# Patient Record
Sex: Female | Born: 1960 | Race: Black or African American | Hispanic: No | Smoking: Former smoker
Health system: Southern US, Community
[De-identification: ages and names within clinical notes are randomized; demographics above are authoritative.]

## PROBLEM LIST (undated history)

## (undated) DIAGNOSIS — F419 Anxiety disorder, unspecified: Secondary | ICD-10-CM

## (undated) DIAGNOSIS — F32A Depression, unspecified: Secondary | ICD-10-CM

## (undated) DIAGNOSIS — I10 Essential (primary) hypertension: Secondary | ICD-10-CM

## (undated) DIAGNOSIS — E119 Type 2 diabetes mellitus without complications: Secondary | ICD-10-CM

## (undated) DIAGNOSIS — N289 Disorder of kidney and ureter, unspecified: Secondary | ICD-10-CM

## (undated) DIAGNOSIS — F329 Major depressive disorder, single episode, unspecified: Secondary | ICD-10-CM

## (undated) DIAGNOSIS — J449 Chronic obstructive pulmonary disease, unspecified: Secondary | ICD-10-CM

## (undated) DIAGNOSIS — I639 Cerebral infarction, unspecified: Secondary | ICD-10-CM

## (undated) DIAGNOSIS — J45909 Unspecified asthma, uncomplicated: Secondary | ICD-10-CM

---
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
## 2017-11-25 ENCOUNTER — Other Ambulatory Visit: Payer: Self-pay | Admitting: Internal Medicine

## 2017-11-25 DIAGNOSIS — N184 Chronic kidney disease, stage 4 (severe): Secondary | ICD-10-CM

## 2017-11-27 ENCOUNTER — Ambulatory Visit
Admission: RE | Admit: 2017-11-27 | Discharge: 2017-11-27 | Disposition: A | Discharge status: Home / Self Care | Payer: Medicare Other | Source: Ambulatory Visit | Attending: Internal Medicine | Admitting: Internal Medicine

## 2017-11-27 DIAGNOSIS — N184 Chronic kidney disease, stage 4 (severe): Secondary | ICD-10-CM

## 2018-10-26 ENCOUNTER — Emergency Department (HOSPITAL_COMMUNITY): Payer: Medicare Other

## 2018-10-26 ENCOUNTER — Other Ambulatory Visit: Payer: Self-pay

## 2018-10-26 ENCOUNTER — Encounter (HOSPITAL_COMMUNITY): Payer: Self-pay | Admitting: Emergency Medicine

## 2018-10-26 ENCOUNTER — Emergency Department (HOSPITAL_COMMUNITY)
Admission: EM | Admit: 2018-10-26 | Discharge: 2018-10-26 | Disposition: A | Discharge status: Home / Self Care | Payer: Medicare Other | Attending: Emergency Medicine | Admitting: Emergency Medicine

## 2018-10-26 DIAGNOSIS — R200 Anesthesia of skin: Secondary | ICD-10-CM | POA: Diagnosis not present

## 2018-10-26 DIAGNOSIS — Z8673 Personal history of transient ischemic attack (TIA), and cerebral infarction without residual deficits: Secondary | ICD-10-CM | POA: Diagnosis not present

## 2018-10-26 DIAGNOSIS — W010XXA Fall on same level from slipping, tripping and stumbling without subsequent striking against object, initial encounter: Secondary | ICD-10-CM | POA: Diagnosis not present

## 2018-10-26 DIAGNOSIS — J449 Chronic obstructive pulmonary disease, unspecified: Secondary | ICD-10-CM | POA: Diagnosis not present

## 2018-10-26 DIAGNOSIS — Y998 Other external cause status: Secondary | ICD-10-CM | POA: Diagnosis not present

## 2018-10-26 DIAGNOSIS — Y92018 Other place in single-family (private) house as the place of occurrence of the external cause: Secondary | ICD-10-CM | POA: Insufficient documentation

## 2018-10-26 DIAGNOSIS — Y9301 Activity, walking, marching and hiking: Secondary | ICD-10-CM | POA: Insufficient documentation

## 2018-10-26 DIAGNOSIS — E119 Type 2 diabetes mellitus without complications: Secondary | ICD-10-CM | POA: Diagnosis not present

## 2018-10-26 DIAGNOSIS — I10 Essential (primary) hypertension: Secondary | ICD-10-CM | POA: Insufficient documentation

## 2018-10-26 DIAGNOSIS — F17200 Nicotine dependence, unspecified, uncomplicated: Secondary | ICD-10-CM | POA: Diagnosis not present

## 2018-10-26 DIAGNOSIS — M79602 Pain in left arm: Secondary | ICD-10-CM | POA: Diagnosis not present

## 2018-10-26 DIAGNOSIS — W19XXXA Unspecified fall, initial encounter: Secondary | ICD-10-CM

## 2018-10-26 HISTORY — DX: Essential (primary) hypertension: I10

## 2018-10-26 HISTORY — DX: Disorder of kidney and ureter, unspecified: N28.9

## 2018-10-26 HISTORY — DX: Depression, unspecified: F32.A

## 2018-10-26 HISTORY — DX: Unspecified asthma, uncomplicated: J45.909

## 2018-10-26 HISTORY — DX: Anxiety disorder, unspecified: F41.9

## 2018-10-26 HISTORY — DX: Type 2 diabetes mellitus without complications: E11.9

## 2018-10-26 HISTORY — DX: Cerebral infarction, unspecified: I63.9

## 2018-10-26 HISTORY — DX: Chronic obstructive pulmonary disease, unspecified: J44.9

## 2018-10-26 MED ORDER — OXYCODONE-ACETAMINOPHEN 5-325 MG PO TABS
1.0000 | ORAL_TABLET | Freq: Once | ORAL | Status: AC
  Administered 2018-10-26: 1 via ORAL
  Filled 2018-10-26: qty 1

## 2018-10-26 MED ORDER — OXYCODONE-ACETAMINOPHEN 5-325 MG PO TABS
2.0000 | ORAL_TABLET | Freq: Once | ORAL | Status: AC
  Administered 2018-10-26: 2 via ORAL
  Filled 2018-10-26: qty 2

## 2018-10-26 NOTE — Discharge Instructions (Signed)
You can take 1000 mg of Tylenol.  Do not exceed 4000 mg of Tylenol a day.  Wear sling for support and stabilization.  Follow-up with your orthopedic doctor tomorrow as previously scheduled.  Return the emergency department for any worsening pain, numbness/weakness or any other worsening or concerning symptoms.

## 2018-10-26 NOTE — ED Notes (Signed)
Patient transported to X-ray 
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
## 2018-10-26 NOTE — ED Notes (Signed)
Patient verbalizes understanding of discharge instructions. Opportunity for questioning and answers were provided. Armband removed by staff, pt discharged from ED ambulatory w/ niece

## 2019-05-31 ENCOUNTER — Encounter (HOSPITAL_COMMUNITY): Payer: Self-pay | Admitting: Emergency Medicine

## 2019-05-31 ENCOUNTER — Inpatient Hospital Stay (HOSPITAL_COMMUNITY)
Admission: EM | Admit: 2019-05-31 | Discharge: 2019-06-09 | DRG: 037 | Disposition: A | Discharge status: Skilled Nursing Facility | Payer: Medicare Other | Attending: Internal Medicine | Admitting: Internal Medicine

## 2019-05-31 ENCOUNTER — Emergency Department (HOSPITAL_COMMUNITY): Payer: Medicare Other

## 2019-05-31 ENCOUNTER — Other Ambulatory Visit: Payer: Self-pay

## 2019-05-31 ENCOUNTER — Inpatient Hospital Stay (HOSPITAL_COMMUNITY): Payer: Medicare Other

## 2019-05-31 DIAGNOSIS — G6289 Other specified polyneuropathies: Secondary | ICD-10-CM | POA: Diagnosis not present

## 2019-05-31 DIAGNOSIS — J449 Chronic obstructive pulmonary disease, unspecified: Secondary | ICD-10-CM | POA: Diagnosis present

## 2019-05-31 DIAGNOSIS — E872 Acidosis: Secondary | ICD-10-CM | POA: Diagnosis present

## 2019-05-31 DIAGNOSIS — E1122 Type 2 diabetes mellitus with diabetic chronic kidney disease: Secondary | ICD-10-CM | POA: Diagnosis present

## 2019-05-31 DIAGNOSIS — E1100 Type 2 diabetes mellitus with hyperosmolarity without nonketotic hyperglycemic-hyperosmolar coma (NKHHC): Secondary | ICD-10-CM

## 2019-05-31 DIAGNOSIS — I63239 Cerebral infarction due to unspecified occlusion or stenosis of unspecified carotid arteries: Secondary | ICD-10-CM | POA: Diagnosis not present

## 2019-05-31 DIAGNOSIS — I129 Hypertensive chronic kidney disease with stage 1 through stage 4 chronic kidney disease, or unspecified chronic kidney disease: Secondary | ICD-10-CM | POA: Diagnosis present

## 2019-05-31 DIAGNOSIS — Z794 Long term (current) use of insulin: Secondary | ICD-10-CM | POA: Diagnosis not present

## 2019-05-31 DIAGNOSIS — N179 Acute kidney failure, unspecified: Secondary | ICD-10-CM | POA: Insufficient documentation

## 2019-05-31 DIAGNOSIS — E1159 Type 2 diabetes mellitus with other circulatory complications: Secondary | ICD-10-CM

## 2019-05-31 DIAGNOSIS — R739 Hyperglycemia, unspecified: Secondary | ICD-10-CM

## 2019-05-31 DIAGNOSIS — I6389 Other cerebral infarction: Secondary | ICD-10-CM | POA: Diagnosis not present

## 2019-05-31 DIAGNOSIS — D649 Anemia, unspecified: Secondary | ICD-10-CM | POA: Diagnosis present

## 2019-05-31 DIAGNOSIS — F17200 Nicotine dependence, unspecified, uncomplicated: Secondary | ICD-10-CM | POA: Diagnosis present

## 2019-05-31 DIAGNOSIS — E875 Hyperkalemia: Secondary | ICD-10-CM | POA: Diagnosis present

## 2019-05-31 DIAGNOSIS — E1149 Type 2 diabetes mellitus with other diabetic neurological complication: Secondary | ICD-10-CM

## 2019-05-31 DIAGNOSIS — I639 Cerebral infarction, unspecified: Secondary | ICD-10-CM | POA: Diagnosis not present

## 2019-05-31 DIAGNOSIS — E876 Hypokalemia: Secondary | ICD-10-CM | POA: Diagnosis not present

## 2019-05-31 DIAGNOSIS — B370 Candidal stomatitis: Secondary | ICD-10-CM | POA: Diagnosis present

## 2019-05-31 DIAGNOSIS — I10 Essential (primary) hypertension: Secondary | ICD-10-CM | POA: Diagnosis not present

## 2019-05-31 DIAGNOSIS — I69351 Hemiplegia and hemiparesis following cerebral infarction affecting right dominant side: Secondary | ICD-10-CM | POA: Diagnosis not present

## 2019-05-31 DIAGNOSIS — E1110 Type 2 diabetes mellitus with ketoacidosis without coma: Secondary | ICD-10-CM

## 2019-05-31 DIAGNOSIS — N183 Chronic kidney disease, stage 3 unspecified: Secondary | ICD-10-CM | POA: Diagnosis present

## 2019-05-31 DIAGNOSIS — Z20822 Contact with and (suspected) exposure to covid-19: Secondary | ICD-10-CM | POA: Diagnosis present

## 2019-05-31 DIAGNOSIS — N1831 Chronic kidney disease, stage 3a: Secondary | ICD-10-CM | POA: Diagnosis not present

## 2019-05-31 DIAGNOSIS — E1165 Type 2 diabetes mellitus with hyperglycemia: Secondary | ICD-10-CM | POA: Diagnosis not present

## 2019-05-31 DIAGNOSIS — R4781 Slurred speech: Secondary | ICD-10-CM | POA: Diagnosis present

## 2019-05-31 DIAGNOSIS — R531 Weakness: Secondary | ICD-10-CM | POA: Diagnosis present

## 2019-05-31 DIAGNOSIS — I6521 Occlusion and stenosis of right carotid artery: Principal | ICD-10-CM | POA: Diagnosis present

## 2019-05-31 DIAGNOSIS — E781 Pure hyperglyceridemia: Secondary | ICD-10-CM | POA: Diagnosis not present

## 2019-05-31 DIAGNOSIS — E118 Type 2 diabetes mellitus with unspecified complications: Secondary | ICD-10-CM | POA: Diagnosis not present

## 2019-05-31 LAB — CBC
HCT: 42.1 % (ref 36.0–46.0)
Hemoglobin: 13 g/dL (ref 12.0–15.0)
MCH: 27.1 pg (ref 26.0–34.0)
MCHC: 30.9 g/dL (ref 30.0–36.0)
MCV: 87.7 fL (ref 80.0–100.0)
Platelets: 411 10*3/uL — ABNORMAL HIGH (ref 150–400)
RBC: 4.8 MIL/uL (ref 3.87–5.11)
RDW: 14.4 % (ref 11.5–15.5)
WBC: 6.9 10*3/uL (ref 4.0–10.5)
nRBC: 0 % (ref 0.0–0.2)

## 2019-05-31 LAB — GLUCOSE, CAPILLARY
Glucose-Capillary: 221 mg/dL — ABNORMAL HIGH (ref 70–99)
Glucose-Capillary: 288 mg/dL — ABNORMAL HIGH (ref 70–99)
Glucose-Capillary: 329 mg/dL — ABNORMAL HIGH (ref 70–99)

## 2019-05-31 LAB — POCT I-STAT EG7
Acid-base deficit: 9 mmol/L — ABNORMAL HIGH (ref 0.0–2.0)
Bicarbonate: 16.7 mmol/L — ABNORMAL LOW (ref 20.0–28.0)
Calcium, Ion: 1.15 mmol/L (ref 1.15–1.40)
HCT: 39 % (ref 36.0–46.0)
Hemoglobin: 13.3 g/dL (ref 12.0–15.0)
O2 Saturation: 99 %
Potassium: 6 mmol/L — ABNORMAL HIGH (ref 3.5–5.1)
Sodium: 121 mmol/L — ABNORMAL LOW (ref 135–145)
TCO2: 18 mmol/L — ABNORMAL LOW (ref 22–32)
pCO2, Ven: 36.4 mmHg — ABNORMAL LOW (ref 44.0–60.0)
pH, Ven: 7.27 (ref 7.250–7.430)
pO2, Ven: 149 mmHg — ABNORMAL HIGH (ref 32.0–45.0)

## 2019-05-31 LAB — CBG MONITORING, ED
Glucose-Capillary: >600 mg/dL (ref 70–99)
Glucose-Capillary: >600 mg/dL (ref 70–99)
Glucose-Capillary: >600 mg/dL (ref 70–99)
Glucose-Capillary: >600 mg/dL (ref 70–99)
Glucose-Capillary: >600 mg/dL (ref 70–99)
Glucose-Capillary: >600 mg/dL (ref 70–99)
Glucose-Capillary: 572 mg/dL (ref 70–99)
Glucose-Capillary: 438 mg/dL — ABNORMAL HIGH (ref 70–99)
Glucose-Capillary: 474 mg/dL — ABNORMAL HIGH (ref 70–99)

## 2019-05-31 LAB — URINALYSIS, ROUTINE W REFLEX MICROSCOPIC
Bacteria, UA: NONE SEEN
Bilirubin Urine: NEGATIVE
Glucose, UA: >=500 mg/dL — AB
Hgb urine dipstick: NEGATIVE
Ketones, ur: NEGATIVE mg/dL
Leukocytes,Ua: NEGATIVE
Nitrite: NEGATIVE
Protein, ur: 100 mg/dL — AB
Specific Gravity, Urine: 1.02 (ref 1.005–1.030)
pH: 6 (ref 5.0–8.0)

## 2019-05-31 LAB — BASIC METABOLIC PANEL
Anion gap: 15 (ref 5–15)
BUN: 68 mg/dL — ABNORMAL HIGH (ref 6–20)
CO2: 16 mmol/L — ABNORMAL LOW (ref 22–32)
Calcium: 9.2 mg/dL (ref 8.9–10.3)
Chloride: 89 mmol/L — ABNORMAL LOW (ref 98–111)
Creatinine, Ser: 3.83 mg/dL — ABNORMAL HIGH (ref 0.44–1.00)
GFR calc Af Amer: 14 mL/min — ABNORMAL LOW (ref >60)
GFR calc non Af Amer: 12 mL/min — ABNORMAL LOW (ref >60)
Glucose, Bld: 1188 mg/dL (ref 70–99)
Potassium: 6 mmol/L — ABNORMAL HIGH (ref 3.5–5.1)
Sodium: 120 mmol/L — ABNORMAL LOW (ref 135–145)

## 2019-05-31 LAB — HEPATIC FUNCTION PANEL
ALT: 11 U/L (ref 0–44)
AST: 11 U/L — ABNORMAL LOW (ref 15–41)
Albumin: 3.2 g/dL — ABNORMAL LOW (ref 3.5–5.0)
Alkaline Phosphatase: 207 U/L — ABNORMAL HIGH (ref 38–126)
Bilirubin, Direct: <0.1 mg/dL (ref 0.0–0.2)
Total Bilirubin: 0.7 mg/dL (ref 0.3–1.2)
Total Protein: 7.5 g/dL (ref 6.5–8.1)

## 2019-05-31 LAB — ETHANOL: Alcohol, Ethyl (B): <10 mg/dL (ref <10)

## 2019-05-31 LAB — I-STAT BETA HCG BLOOD, ED (MC, WL, AP ONLY): I-stat hCG, quantitative: 10.7 m[IU]/mL — ABNORMAL HIGH (ref <5)

## 2019-05-31 LAB — RAPID URINE DRUG SCREEN, HOSP PERFORMED
Amphetamines: NOT DETECTED
Barbiturates: NOT DETECTED
Benzodiazepines: NOT DETECTED
Cocaine: NOT DETECTED
Opiates: NOT DETECTED
Tetrahydrocannabinol: NOT DETECTED

## 2019-05-31 MED ORDER — LABETALOL HCL 5 MG/ML IV SOLN: 10.0000 mg | INTRAVENOUS | Status: DC | PRN

## 2019-05-31 MED ORDER — DOXYCYCLINE HYCLATE 100 MG PO TABS
100.0000 mg | ORAL_TABLET | Freq: Two times a day (BID) | ORAL | Status: AC
  Administered 2019-05-31 – 2019-06-03 (×6): 100 mg via ORAL
  Filled 2019-05-31 (×7): qty 1

## 2019-05-31 MED ORDER — INSULIN REGULAR(HUMAN) IN NACL 100-0.9 UT/100ML-% IV SOLN
INTRAVENOUS | Status: DC
  Administered 2019-05-31: 4.4 [IU]/h via INTRAVENOUS
  Filled 2019-05-31: qty 100

## 2019-05-31 MED ORDER — ROSUVASTATIN CALCIUM 20 MG PO TABS
40.0000 mg | ORAL_TABLET | Freq: Every day | ORAL | Status: DC
  Administered 2019-06-01 – 2019-06-09 (×9): 40 mg via ORAL
  Filled 2019-05-31 (×9): qty 2

## 2019-05-31 MED ORDER — ZOLPIDEM TARTRATE 5 MG PO TABS
5.0000 mg | ORAL_TABLET | Freq: Every evening | ORAL | Status: DC | PRN
  Administered 2019-06-01 – 2019-06-07 (×8): 5 mg via ORAL
  Filled 2019-05-31 (×9): qty 1

## 2019-05-31 MED ORDER — HEPARIN SODIUM (PORCINE) 5000 UNIT/ML IJ SOLN
5000.0000 [IU] | Freq: Three times a day (TID) | INTRAMUSCULAR | Status: DC
  Administered 2019-05-31 – 2019-06-09 (×27): 5000 [IU] via SUBCUTANEOUS
  Filled 2019-05-31 (×27): qty 1

## 2019-05-31 MED ORDER — ADULT MULTIVITAMIN W/MINERALS CH
1.0000 | ORAL_TABLET | Freq: Every day | ORAL | Status: DC
  Administered 2019-06-01 – 2019-06-09 (×8): 1 via ORAL
  Filled 2019-05-31 (×8): qty 1

## 2019-05-31 MED ORDER — DEXTROSE-NACL 5-0.45 % IV SOLN: INTRAVENOUS | Status: DC

## 2019-05-31 MED ORDER — ACETAMINOPHEN 325 MG PO TABS
650.0000 mg | ORAL_TABLET | Freq: Four times a day (QID) | ORAL | Status: DC | PRN
  Administered 2019-06-01 – 2019-06-09 (×7): 650 mg via ORAL
  Filled 2019-05-31 (×8): qty 2

## 2019-05-31 MED ORDER — ALBUTEROL SULFATE (2.5 MG/3ML) 0.083% IN NEBU: 3.0000 mL | INHALATION_SOLUTION | Freq: Four times a day (QID) | RESPIRATORY_TRACT | Status: DC | PRN

## 2019-05-31 MED ORDER — SODIUM CHLORIDE 0.9 % IV SOLN: INTRAVENOUS | Status: DC

## 2019-05-31 MED ORDER — SODIUM CHLORIDE 0.9 % IV BOLUS
1000.0000 mL | Freq: Once | INTRAVENOUS | Status: AC
  Administered 2019-05-31: 1000 mL via INTRAVENOUS

## 2019-05-31 MED ORDER — DIPHENHYDRAMINE HCL 25 MG PO CAPS
25.0000 mg | ORAL_CAPSULE | Freq: Four times a day (QID) | ORAL | Status: DC | PRN
  Administered 2019-05-31 – 2019-06-09 (×2): 25 mg via ORAL
  Filled 2019-05-31 (×2): qty 1

## 2019-05-31 MED ORDER — LORAZEPAM 0.5 MG PO TABS
0.5000 mg | ORAL_TABLET | Freq: Four times a day (QID) | ORAL | Status: DC | PRN
  Filled 2019-05-31: qty 1

## 2019-05-31 MED ORDER — SODIUM CHLORIDE 0.9 % IV BOLUS
500.0000 mL | Freq: Once | INTRAVENOUS | Status: AC
  Administered 2019-05-31: 500 mL via INTRAVENOUS

## 2019-05-31 MED ORDER — STROKE: EARLY STAGES OF RECOVERY BOOK
Freq: Once | Status: AC
  Filled 2019-05-31: qty 1

## 2019-05-31 MED ORDER — SODIUM ZIRCONIUM CYCLOSILICATE 10 G PO PACK
10.0000 g | PACK | Freq: Two times a day (BID) | ORAL | Status: AC
  Administered 2019-05-31 – 2019-06-01 (×2): 10 g via ORAL
  Filled 2019-05-31 (×2): qty 1

## 2019-05-31 MED ORDER — NYSTATIN 100000 UNIT/ML MT SUSP
5.0000 mL | Freq: Four times a day (QID) | OROMUCOSAL | Status: DC
  Administered 2019-05-31 – 2019-06-09 (×34): 500000 [IU] via ORAL
  Filled 2019-05-31 (×36): qty 5

## 2019-05-31 MED ORDER — DEXTROSE 50 % IV SOLN: 0.0000 mL | INTRAVENOUS | Status: DC | PRN

## 2019-05-31 MED ORDER — INSULIN REGULAR(HUMAN) IN NACL 100-0.9 UT/100ML-% IV SOLN
INTRAVENOUS | Status: DC
  Administered 2019-05-31: 4.4 [IU]/h via INTRAVENOUS
  Administered 2019-06-01: 4.6 [IU]/h via INTRAVENOUS
  Filled 2019-05-31: qty 100

## 2019-05-31 MED ORDER — SODIUM BICARBONATE 650 MG PO TABS
650.0000 mg | ORAL_TABLET | Freq: Three times a day (TID) | ORAL | Status: DC
  Administered 2019-05-31 – 2019-06-09 (×26): 650 mg via ORAL
  Filled 2019-05-31 (×27): qty 1

## 2019-05-31 NOTE — ED Notes (Signed)
Pt transported to MRI 

## 2019-05-31 NOTE — ED Notes (Signed)
Pt transported to CT ?
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
## 2019-05-31 NOTE — ED Triage Notes (Signed)
Pt arrives to ED from home with complaints of left leg tingling, polyuria, polydipsia and weakness since Saturday. Patients blood sugar is >600 in triage.

## 2019-05-31 NOTE — ED Notes (Signed)
Date and time results received: 05/31/19  Test: Glucose Critical Value: 1188  Name of Provider Notified: Ray MD

## 2019-05-31 NOTE — ED Notes (Signed)
I stuck pt for labs I was unable to get them
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
## 2019-06-01 ENCOUNTER — Inpatient Hospital Stay (HOSPITAL_COMMUNITY): Payer: Medicare Other

## 2019-06-01 DIAGNOSIS — I639 Cerebral infarction, unspecified: Secondary | ICD-10-CM

## 2019-06-01 DIAGNOSIS — G6289 Other specified polyneuropathies: Secondary | ICD-10-CM

## 2019-06-01 DIAGNOSIS — I6389 Other cerebral infarction: Secondary | ICD-10-CM

## 2019-06-01 DIAGNOSIS — E1165 Type 2 diabetes mellitus with hyperglycemia: Secondary | ICD-10-CM

## 2019-06-01 DIAGNOSIS — E1100 Type 2 diabetes mellitus with hyperosmolarity without nonketotic hyperglycemic-hyperosmolar coma (NKHHC): Secondary | ICD-10-CM

## 2019-06-01 DIAGNOSIS — E118 Type 2 diabetes mellitus with unspecified complications: Secondary | ICD-10-CM

## 2019-06-01 DIAGNOSIS — J449 Chronic obstructive pulmonary disease, unspecified: Secondary | ICD-10-CM

## 2019-06-01 DIAGNOSIS — I63239 Cerebral infarction due to unspecified occlusion or stenosis of unspecified carotid arteries: Secondary | ICD-10-CM

## 2019-06-01 DIAGNOSIS — I6521 Occlusion and stenosis of right carotid artery: Secondary | ICD-10-CM

## 2019-06-01 DIAGNOSIS — E872 Acidosis: Secondary | ICD-10-CM

## 2019-06-01 DIAGNOSIS — E781 Pure hyperglyceridemia: Secondary | ICD-10-CM

## 2019-06-01 LAB — GLUCOSE, CAPILLARY
Glucose-Capillary: 164 mg/dL — ABNORMAL HIGH (ref 70–99)
Glucose-Capillary: 167 mg/dL — ABNORMAL HIGH (ref 70–99)
Glucose-Capillary: 172 mg/dL — ABNORMAL HIGH (ref 70–99)
Glucose-Capillary: 185 mg/dL — ABNORMAL HIGH (ref 70–99)
Glucose-Capillary: 187 mg/dL — ABNORMAL HIGH (ref 70–99)
Glucose-Capillary: 189 mg/dL — ABNORMAL HIGH (ref 70–99)
Glucose-Capillary: 197 mg/dL — ABNORMAL HIGH (ref 70–99)
Glucose-Capillary: 200 mg/dL — ABNORMAL HIGH (ref 70–99)
Glucose-Capillary: 203 mg/dL — ABNORMAL HIGH (ref 70–99)
Glucose-Capillary: 219 mg/dL — ABNORMAL HIGH (ref 70–99)
Glucose-Capillary: 220 mg/dL — ABNORMAL HIGH (ref 70–99)
Glucose-Capillary: 220 mg/dL — ABNORMAL HIGH (ref 70–99)
Glucose-Capillary: 221 mg/dL — ABNORMAL HIGH (ref 70–99)
Glucose-Capillary: 271 mg/dL — ABNORMAL HIGH (ref 70–99)
Glucose-Capillary: 279 mg/dL — ABNORMAL HIGH (ref 70–99)
Glucose-Capillary: 307 mg/dL — ABNORMAL HIGH (ref 70–99)
Glucose-Capillary: 443 mg/dL — ABNORMAL HIGH (ref 70–99)

## 2019-06-01 LAB — LIPID PANEL
Cholesterol: 190 mg/dL (ref 0–200)
HDL: 26 mg/dL — ABNORMAL LOW (ref >40)
LDL Cholesterol: UNDETERMINED mg/dL (ref 0–99)
Total CHOL/HDL Ratio: 7.3 RATIO
Triglycerides: 776 mg/dL — ABNORMAL HIGH (ref <150)
VLDL: UNDETERMINED mg/dL (ref 0–40)

## 2019-06-01 LAB — BASIC METABOLIC PANEL
Anion gap: 11 (ref 5–15)
Anion gap: 12 (ref 5–15)
BUN: 53 mg/dL — ABNORMAL HIGH (ref 6–20)
BUN: 47 mg/dL — ABNORMAL HIGH (ref 6–20)
CO2: 17 mmol/L — ABNORMAL LOW (ref 22–32)
CO2: 14 mmol/L — ABNORMAL LOW (ref 22–32)
Calcium: 9.4 mg/dL (ref 8.9–10.3)
Calcium: 8.9 mg/dL (ref 8.9–10.3)
Chloride: 111 mmol/L (ref 98–111)
Chloride: 111 mmol/L (ref 98–111)
Creatinine, Ser: 2.86 mg/dL — ABNORMAL HIGH (ref 0.44–1.00)
Creatinine, Ser: 2.74 mg/dL — ABNORMAL HIGH (ref 0.44–1.00)
GFR calc Af Amer: 20 mL/min — ABNORMAL LOW (ref >60)
GFR calc Af Amer: 22 mL/min — ABNORMAL LOW (ref >60)
GFR calc non Af Amer: 18 mL/min — ABNORMAL LOW (ref >60)
GFR calc non Af Amer: 19 mL/min — ABNORMAL LOW (ref >60)
Glucose, Bld: 277 mg/dL — ABNORMAL HIGH (ref 70–99)
Glucose, Bld: 199 mg/dL — ABNORMAL HIGH (ref 70–99)
Potassium: 3.9 mmol/L (ref 3.5–5.1)
Potassium: 3.8 mmol/L (ref 3.5–5.1)
Sodium: 139 mmol/L (ref 135–145)
Sodium: 137 mmol/L (ref 135–145)

## 2019-06-01 LAB — ECHOCARDIOGRAM COMPLETE
Height: 66 in
Weight: 2800 oz

## 2019-06-01 LAB — HEMOGLOBIN A1C
Hgb A1c MFr Bld: >15.5 % — ABNORMAL HIGH (ref 4.8–5.6)
Mean Plasma Glucose: >398 mg/dL

## 2019-06-01 LAB — HIV ANTIBODY (ROUTINE TESTING W REFLEX): HIV Screen 4th Generation wRfx: NONREACTIVE

## 2019-06-01 LAB — LDL CHOLESTEROL, DIRECT: Direct LDL: 44 mg/dL (ref 0–99)

## 2019-06-01 LAB — BETA-HYDROXYBUTYRIC ACID
Beta-Hydroxybutyric Acid: 0.14 mmol/L (ref 0.05–0.27)
Beta-Hydroxybutyric Acid: 0.29 mmol/L — ABNORMAL HIGH (ref 0.05–0.27)

## 2019-06-01 MED ORDER — BUPRENORPHINE 7.5 MCG/HR TD PTWK
1.0000 | MEDICATED_PATCH | TRANSDERMAL | Status: DC
  Administered 2019-06-03: 1 via TRANSDERMAL
  Filled 2019-06-01: qty 1

## 2019-06-01 MED ORDER — LIVING WELL WITH DIABETES BOOK
Freq: Once | Status: AC
  Filled 2019-06-01: qty 1

## 2019-06-01 MED ORDER — BUPRENORPHINE 7.5 MCG/HR TD PTWK: 1.0000 | MEDICATED_PATCH | TRANSDERMAL | Status: DC

## 2019-06-01 MED ORDER — INSULIN ASPART 100 UNIT/ML ~~LOC~~ SOLN
0.0000 [IU] | SUBCUTANEOUS | Status: DC
  Administered 2019-06-01: 7 [IU] via SUBCUTANEOUS
  Administered 2019-06-01: 5 [IU] via SUBCUTANEOUS
  Administered 2019-06-02: 2 [IU] via SUBCUTANEOUS
  Administered 2019-06-02: 5 [IU] via SUBCUTANEOUS

## 2019-06-01 MED ORDER — INSULIN DETEMIR 100 UNIT/ML ~~LOC~~ SOLN
16.0000 [IU] | Freq: Every day | SUBCUTANEOUS | Status: DC
  Administered 2019-06-01 – 2019-06-02 (×2): 16 [IU] via SUBCUTANEOUS
  Filled 2019-06-01 (×2): qty 0.16

## 2019-06-01 MED ORDER — INSULIN STARTER KIT- PEN NEEDLES (ENGLISH)
1.0000 | Freq: Once | Status: AC
  Administered 2019-06-01: 1
  Filled 2019-06-01: qty 1
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
## 2019-06-01 NOTE — Plan of Care (Signed)

## 2019-06-01 NOTE — Progress Notes (Signed)
Orthopedic Tech Progress Note Patient Details:  Lauren Wang [birth year] [id number] ORDER called in by [place]. For a [place] Patient ID: Lauren Wang, female   DOB: Oct 11, 1960, 59 y.o.   MRN: [id number]   Janit Pagan 06/01/2019, 5:53 PM

## 2019-06-01 NOTE — Progress Notes (Signed)
THERAPY  Recommending LLE brace to prevent buckle such as KI or hinge brace from [employer]. Please place order if agreed for next session.   Fleeta Emmer, OTR/L  Acute Rehabilitation Services Pager: [phone] Office: [phone] .

## 2019-06-01 NOTE — Progress Notes (Signed)
Inpatient Diabetes Program Recommendations  AACE/ADA: New Consensus Statement on Inpatient Glycemic Control   Target Ranges:  Prepandial:   less than 140 mg/dL      Peak postprandial:   less than 180 mg/dL (1-2 hours)      Critically ill patients:  140 - 180 mg/dL   Results for Lauren Wang, Lauren Wang (MRN [id number]) as of 06/01/2019 14:40  Ref. Range 06/01/2019 07:19 06/01/2019 08:22 06/01/2019 09:20 06/01/2019 10:24 06/01/2019 11:25 06/01/2019 12:01 06/01/2019 13:07 06/01/2019 14:09  Glucose-Capillary Latest Ref Range: 70 - 99 mg/dL 187 (H) 221 (H) 220 (H) 189 (H) 203 (H) 279 (H) 219 (H) 197 (H)  Results for Lauren Wang, Lauren Wang (MRN [id number]) as of 06/01/2019 14:40  Ref. Range 05/31/2019 12:58 05/31/2019 15:39  Glucose Latest Ref Range: 70 - 99 mg/dL 1,188 (HH)   Hemoglobin A1C Latest Ref Range: 4.8 - 5.6 %  >15.5 (H)   Review of Glycemic Control  Diabetes history: DM2 Outpatient Diabetes medications: None (was on 2 oral DM meds in past but PCP stopped about 5 years ago) Current orders for Inpatient glycemic control: IV insulin  Inpatient Diabetes Program Recommendations:    Insulin at transition from IV to SQ insulin: Once MD is ready to transition from IV to SQ insulin, please consider ordering Levemir 16 units Q24H (based on 79.4 kg x 0.2 units), CBGs Q4H, and Novolog 0-9 units Q4H.  HbgA1C:  A1C greater than 15.5% indicating an average glucose over 398 mg/dl over the past 2-3 months. Noted A1C of 6.2% on 09/22/2018 in [place].  NOTE: Spoke with patient about diabetes and home regimen for diabetes control. Patient reports being followed by PCP for diabetes management. Patient reports that she use to take 2 oral DM medications (Metformin and can not remember the other medication) and that her PCP took her off of them about 5 years ago because her DM was well controlled. Patient reports that she had a visit with her PCP on March 8th and glucose was noted to be 150 mg/dl at the time of visit. She had  televisit with PCP yesterday and PCP prescribed glucometer and testing supplies which got filled and noted glucose to be "HI" on the meter. Inquired about any recent steroids and patient notes she was on Prednisone for a week which ended about 1 month ago.  Patient also notes that when she got thrush she did not have an appetite and nothing tasted good expect sweet things so she has been drinking a lot of juice and eating watermelon, cantaloupe, and honey dew melon over the past week.  Patient reports she has been having symptoms of hyperglycemia for past few days.  Discussed A1C results (>15.5% on 05/31/19) and explained that current A1C indicates an average glucose greater than 398 mg/dl over the past 2-3 months. Discussed glucose and A1C goals. Discussed importance of checking CBGs and maintaining good CBG control to prevent long-term and short-term complications. Explained how hyperglycemia leads to damage within blood vessels which lead to the common complications seen with uncontrolled diabetes. Stressed to the patient the importance of improving glycemic control to prevent further complications from uncontrolled diabetes. Discussed impact of nutrition, exercise, stress, sickness, and medications on diabetes control.  Discussed carbohydrates, carbohydrate goals per day and meal, along with portion sizes. Informed patient that given her initial glucose and A1C, she will likely need to be discharged on insulin. Patient states that she use to administer insulin shots to her mother which was over 5 years ago. Reviewed insulin  administration with vial/syringe and insulin pens. Patient was able to successfully demonstrate how to use vial/syringe and insulin pen for insulin injection and states she feels the insulin pen would be easier for her to use.  Informed patient that an insulin pen starter kit and a Living Well with DM book has been ordered. Encouraged patient to read through the information once received.  Informed patient that nursing will be asking her to self administer insulin once she is transitioned to SQ insulin.   Patient verbalized understanding of information discussed and reports no further questions at this time related to diabetes. At time of discharge,if patient is discharged on insulin please provide Rx for insulin pens and pen needles.  Thanks, Barnie Alderman, RN, MSN, CDE Diabetes Coordinator Inpatient Diabetes Program [phone] (Team Pager)

## 2019-06-01 NOTE — Progress Notes (Signed)
  Echocardiogram 2D Echocardiogram has been performed.  Lauren Wang 06/01/2019, 10:05 AM

## 2019-06-01 NOTE — Progress Notes (Signed)
Carotid artery duplex exam completed.  Preliminary results given to [place], RN. Messaged via Epic and paged Dr. Benny Lennert.  Preliminary results can be found under CV proc under chart review.  06/01/2019 12:14 PM  Lauren Wang, K., RDMS, RVT

## 2019-06-01 NOTE — Evaluation (Addendum)
Physical Therapy Evaluation Patient Details Name: Lauren Wang MRN: [id number] DOB: 16-Jan-1961 Today's Date: 06/01/2019   History of Present Illness  59 y.o. 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 She also feels generalized weakness malaise for last month.  Clinical Impression  Pt presents to PT with deficits in functional mobility, gait, balance, strength, power, endurance. Pt with unsteady gait, intermittent L knee buckling or hyperextension due to weakness. Pt requires some physical assistance to steady at this time. Pt will benefit from PT POC and aggressive mobilization to improve LE strength and reduce falls risk.  PT addendum **After discussion with Brynn, OTR/L, patient only has support from niece who has 32 year old son and not boyfriend. Pt with more frequent knee buckling and foot drag and at a significant fall risk. PT updating recommendations to SNF. PT also in agreement for recommendation of LLE brace to improve knee stability during mobility.**      06/01/19 1008  PT Visit Information  Last PT Received On 06/01/19  Assistance Needed +1  History of Present Illness 59 y.o. 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 She also feels generalized weakness malaise for last month.  Precautions  Precautions Fall  Restrictions  Weight Bearing Restrictions No  Home Living  Family/patient expects to be discharged to: Private residence  Living Arrangements Other relatives (niece and nieces boyfriend)  Available Help  at Discharge Family;Available 24 hours/day  Type of Home Apartment  Home Access Stairs to enter  Entrance Stairs-Number of Steps 1  Entrance Stairs-Rails None  Home Layout One level  Bathroom Shower/Tub Tub/shower unit  Research officer, trade union - single point  Prior Function  Level of Independence Independent with assistive device(s)  Comments community ambulator with cane  Communication  Communication No difficulties  Pain Assessment  Pain Assessment No/denies pain  Cognition  Arousal/Alertness Awake/alert  Behavior During Therapy Agitated (mildly agitated with history questions)  Overall Cognitive Status Within Functional Limits for tasks assessed  Upper Extremity Assessment  Upper Extremity Assessment Generalized weakness (RUE, baseline from previous CVA)  Lower Extremity Assessment  Lower Extremity Assessment Generalized weakness (L>R 4-/5 LLE, 4/5 RLE)  Cervical / Trunk Assessment  Cervical / Trunk Assessment Normal  Bed Mobility  Overal bed mobility Needs Assistance  Bed Mobility Supine to Sit;Sit to Supine  Supine to sit Supervision  Sit to supine Supervision  Transfers  Overall transfer level Needs assistance  Equipment used None  Transfers Sit to/from Stand  Sit to Stand Supervision  Ambulation/Gait  Ambulation/Gait assistance Min assist  Gait Distance (Feet) 100 Feet  Assistive device IV Pole  Gait Pattern/deviations Decreased step length - right;Decreased step length - left  General Gait Details intermittent L knee buckle vs hyperextension during stance phase due to weakness  Gait velocity reduced  Gait velocity interpretation 1.31 - 2.62 ft/sec, indicative of limited community ambulator  Balance  Overall balance assessment Needs assistance  Sitting-balance support No upper extremity supported;Feet supported  Sitting balance-Leahy Scale Normal  Standing balance support Single extremity  supported  Standing balance-Leahy Scale Fair   Standing balance comment minG with UE support of IV pole  General Comments  General comments (skin integrity, edema, etc.) VSS on RA  PT - End of Session  Activity Tolerance Patient tolerated treatment well  Patient left in bed;with call bell/phone within reach;with bed alarm set  Nurse Communication Mobility status  PT Assessment  PT Recommendation/Assessment Patient needs continued PT services  PT Visit Diagnosis Unsteadiness on feet (R26.81);Muscle weakness (generalized) (M62.81)  PT Problem List Decreased strength;Decreased activity tolerance;Decreased balance;Decreased mobility;Decreased knowledge of use of DME;Decreased safety awareness;Decreased knowledge of precautions  PT Plan  PT Frequency (ACUTE ONLY) Min 4X/week  PT Treatment/Interventions (ACUTE ONLY) DME instruction;Gait training;Stair training;Functional mobility training;Therapeutic activities;Therapeutic exercise;Balance training;Neuromuscular re-education;Patient/family education  AM-PAC PT "6 Clicks" Mobility Outcome Measure (Version 2)  Help needed turning from your back to your side while in a flat bed without using bedrails? 4  Help needed moving from lying on your back to sitting on the side of a flat bed without using bedrails? 4  Help needed moving to and from a bed to a chair (including a wheelchair)? 4  Help needed standing up from a chair using your arms (e.g., wheelchair or bedside chair)? 4  Help needed to walk in hospital room? 3  Help needed climbing 3-5 steps with a railing?  2  6 Click Score 21  Consider Recommendation of Discharge To: Home with no services  PT Recommendation  Recommendations for Other Services Other (comment) (orthotics for L knee brace)  Follow Up Recommendations SNF;Supervision/Assistance - 24 hour (may progress to home with HHPT)  PT equipment  (TBD at next session, may need RW)  Acute Rehab PT Goals  Patient Stated Goal To return to baseline and improve LE strength  PT Goal  Formulation With patient  Time For Goal Achievement 06/15/19  Potential to Achieve Goals Good  PT Time Calculation  PT Start Time (ACUTE ONLY) 0953  PT Stop Time (ACUTE ONLY) 1008  PT Time Calculation (min) (ACUTE ONLY) 15 min  PT General Charges  $$ ACUTE PT VISIT 1 Visit  PT Evaluation  $PT Eval Moderate Complexity 1 Mod    Zenaida Niece, PT, DPT Acute Rehabilitation Pager: [phone]   Zenaida Niece 06/01/2019, 4:31 PM

## 2019-06-01 NOTE — Consult Note (Addendum)
Hospital Consult    Reason for Consult:  Carotid artery stenosis Requesting Physician:  Dr. Benny Lennert MRN #:  [id number]  History of Present Illness: This is a 59 y.o. female with past medical history significant for COPD, insulin-dependent diabetes mellitus with hemoglobin A1c of 6.2, hypertension, CKD stage III, and prior CVA.  She is being seen in consultation for evaluation of right ICA stenosis estimated to be 80 to 99% by carotid duplex.  She presented to the emergency department with symptoms of left leg numbness and weakness as well as slurring speech since this past Saturday.  MRI negative for any acute or subacute infarction.  She states the symptoms are ongoing and have not improved.  She denies any further strokelike symptoms including vision changes.  She is an everyday smoker.  She is on a daily statin.  She is not on an aspirin at home.  Blood glucose has been uncontrolled as of late due to recent steroid Dosepak for upper respiratory infection.  It should also be mentioned that surgical history is significant for repair of complex radial and ulnar fracture left arm also involving skin grafting  Past Medical History:  Diagnosis Date  . Anxiety   . Asthma   . COPD (chronic obstructive pulmonary disease) ([place])   . Depression   . Diabetes mellitus without complication ([place])   . Hypertension   . Renal disorder   . Stroke [facility])     History reviewed. No pertinent surgical history.  Allergies  Allergen Reactions  . Other Itching    Patient states sulfa causes her yeast infection  . Tape Hives    Prior to Admission medications   Medication Sig Start Date End Date Taking? Authorizing Provider  acetaminophen (TYLENOL) 325 MG tablet Take 650 mg by mouth every 6 (six) hours as needed for pain. 10/12/18  Yes [provider]  albuterol (VENTOLIN HFA) 108 (90 Base) MCG/ACT inhaler Inhale 2 puffs into the lungs every 6 (six) hours as needed for wheezing or shortness of  breath.    Yes [provider]  amLODipine (NORVASC) 10 MG tablet Take 10 mg by mouth daily. 03/26/19  Yes [provider]  Buprenorphine 7.5 MCG/HR PTWK Place 1 patch onto the skin once a week. Friday 01/19/19  Yes [provider]  diphenhydrAMINE (SOMINEX) 25 MG tablet Take 25 mg by mouth every 6 (six) hours as needed for itching. 01/24/19  Yes [provider]  doxycycline (VIBRA-TABS) 100 MG tablet Take 100 mg by mouth 2 (two) times daily. 7 DS 05/27/19  Yes [provider]  lisinopril (ZESTRIL) 10 MG tablet Take 10 mg by mouth daily. 04/17/19  Yes [provider]  Multiple Vitamins-Minerals (ONCOVITE) TABS Take 1 tablet by mouth daily.   Yes [provider]  nystatin (MYCOSTATIN) 100000 UNIT/ML suspension Take 5 mLs by mouth every 6 (six) hours. 05/27/19  Yes [provider]  rosuvastatin (CRESTOR) 10 MG tablet Take 10 mg by mouth daily. 04/17/19  Yes [provider]  zolpidem (AMBIEN) 10 MG tablet Take 10 mg by mouth at bedtime as needed for sleep. 05/30/19  Yes [provider]    Social History   Socioeconomic History  . Marital status: Widowed    Spouse name: Not on file  . Number of children: Not on file  . Years of education: Not on file  . Highest education level: Not on file  Occupational History  . Not on file  Tobacco Use  . Smoking status:  Current Every Day Smoker  . Smokeless tobacco: Never Used  Substance and Sexual Activity  . Alcohol use: Not Currently  . Drug use: Not Currently  . Sexual activity: Not on file  Other Topics Concern  . Not on file  Social History Narrative  . Not on file   Social Determinants of Health   Financial Resource Strain:   . Difficulty of Paying Living Expenses:   Food Insecurity:   . Worried About Charity fundraiser in the Last Year:   . Arboriculturist in the Last Year:   Transportation Needs:   . Film/video editor (Medical):   Marland Kitchen Lack of  Transportation (Non-Medical):   Physical Activity:   . Days of Exercise per Week:   . Minutes of Exercise per Session:   Stress:   . Feeling of Stress :   Social Connections:   . Frequency of Communication with Friends and Family:   . Frequency of Social Gatherings with Friends and Family:   . Attends Religious Services:   . Active Member of Clubs or Organizations:   . Attends Archivist Meetings:   Marland Kitchen Marital Status:   Intimate Partner Violence:   . Fear of Current or Ex-Partner:   . Emotionally Abused:   Marland Kitchen Physically Abused:   . Sexually Abused:      History reviewed. No pertinent family history.  ROS: Otherwise negative unless mentioned in HPI  Physical Examination  Vitals:   06/01/19 0741 06/01/19 0811  BP:    Pulse: 64 64  Resp: 16   Temp: 98.2 F (36.8 C) 98.2 F (36.8 C)  SpO2: 99% 99%   Body mass index is 28.25 kg/m.  General:  WDWN in NAD Gait: Not observed HENT: WNL, normocephalic Pulmonary: normal non-labored breathing, without Rales, rhonchi,  wheezing Cardiac: regular Abdomen:  soft, NT/ND, no masses Skin: without rashes Vascular Exam/Pulses: Symmetrical radial pulses; symmetrical DP pulses Extremities: without ischemic changes, without Gangrene , without cellulitis; without open wounds; well-healed skin graft left forearm Musculoskeletal: no muscle wasting or atrophy  Neurologic: A&O X 3;  No focal weakness or paresthesias are detected; speech is fluent/normal Psychiatric:  The pt has Normal affect. Lymph:  Unremarkable  CBC    Component Value Date/Time   WBC 6.9 05/31/2019 1258   RBC 4.80 05/31/2019 1258   HGB 13.3 05/31/2019 1343   HCT 39.0 05/31/2019 1343   PLT 411 (H) 05/31/2019 1258   MCV 87.7 05/31/2019 1258   MCH 27.1 05/31/2019 1258   MCHC 30.9 05/31/2019 1258   RDW 14.4 05/31/2019 1258    BMET    Component Value Date/Time   NA 137 06/01/2019 0637   K 3.8 06/01/2019 0637   CL 111 06/01/2019 0637   CO2 14 (L)  06/01/2019 0637   GLUCOSE 199 (H) 06/01/2019 0637   BUN 47 (H) 06/01/2019 0637   CREATININE 2.74 (H) 06/01/2019 0637   CALCIUM 8.9 06/01/2019 0637   GFRNONAA 19 (L) 06/01/2019 0637   GFRAA 22 (L) 06/01/2019 1031    COAGS: No results found for: INR, PROTIME   Non-Invasive Vascular Imaging:   80 to 99% right ICA stenosis 40 to 59% left ICA stenosis    ASSESSMENT/PLAN: This is a 59 y.o. female with critical right ICA stenosis and left lower extremity numbness and weakness ongoing  -Critical 80 to 99% right ICA stenosis by carotid duplex; left ICA 40 to 59% stenosis -MRI negative for acute or subacute CVA -Given left  lower extremity numbness and weakness, this is likely a symptomatic right ICA lesion -Agree with aspirin in addition to her home dose of statin -Would recommend holding off on CTA head and neck due to worsening renal function -Duplex suggests carotid bifurcation is high; we may consider CT head without contrast to further evaluate location of bifurcation which would affect plan for revascularization -On call vascular surgeon Dr. Donnetta Hutching will evaluate the patient later today and provide further treatment plans   Dagoberto Ligas PA-C [facility] [phone]  I have examined the patient, reviewed and agree with above.  Very complex patient.  Had left brain stroke 6 years ago with residual right arm and leg weakness.  Presents with new episode of speech difficulty and left sided numbness and weakness from her knee distally.  She is right-handed.  MRI shows no acute stroke.  She does have persistent speech and leg weakness.  She has a very poorly controlled diabetes with blood sugar of greater than 1000 on presentation.  She has renal insufficiency with creatinine of 2.8 on presentation.  Carotid duplex suggest critical right carotid stenosis.  High bifurcation noted on duplex.  Discussed this at length with the patient.  Suspect that her most likely  cause for her new neurologic deficit is her critical right carotid stenosis.  Would prefer to obtain CT angiogram of her neck due to her high bifurcation.  Due to her renal insufficiency will not proceed with contrast study.  Will obtain noncontrast CT to determine location of her carotid bifurcation preoperatively.  Explained that if surgery is indicated, would plan on the 1 to 2-week timeframe.  We will follow with you.  Curt Jews, MD 06/01/2019 3:37 PM

## 2019-06-01 NOTE — Plan of Care (Signed)
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
## 2019-06-02 DIAGNOSIS — Z794 Long term (current) use of insulin: Secondary | ICD-10-CM

## 2019-06-02 DIAGNOSIS — I6521 Occlusion and stenosis of right carotid artery: Principal | ICD-10-CM

## 2019-06-02 DIAGNOSIS — E118 Type 2 diabetes mellitus with unspecified complications: Secondary | ICD-10-CM

## 2019-06-02 DIAGNOSIS — I10 Essential (primary) hypertension: Secondary | ICD-10-CM

## 2019-06-02 LAB — GLUCOSE, CAPILLARY
Glucose-Capillary: 117 mg/dL — ABNORMAL HIGH (ref 70–99)
Glucose-Capillary: 106 mg/dL — ABNORMAL HIGH (ref 70–99)
Glucose-Capillary: 186 mg/dL — ABNORMAL HIGH (ref 70–99)
Glucose-Capillary: 279 mg/dL — ABNORMAL HIGH (ref 70–99)
Glucose-Capillary: 421 mg/dL — ABNORMAL HIGH (ref 70–99)
Glucose-Capillary: 446 mg/dL — ABNORMAL HIGH (ref 70–99)
Glucose-Capillary: 345 mg/dL — ABNORMAL HIGH (ref 70–99)

## 2019-06-02 LAB — CBC WITH DIFFERENTIAL/PLATELET
Abs Immature Granulocytes: 0.01 10*3/uL (ref 0.00–0.07)
Basophils Absolute: 0.1 10*3/uL (ref 0.0–0.1)
Basophils Relative: 1 %
Eosinophils Absolute: 0.3 10*3/uL (ref 0.0–0.5)
Eosinophils Relative: 3 %
HCT: 34.8 % — ABNORMAL LOW (ref 36.0–46.0)
Hemoglobin: 11.3 g/dL — ABNORMAL LOW (ref 12.0–15.0)
Immature Granulocytes: 0 %
Lymphocytes Relative: 51 %
Lymphs Abs: 4.6 10*3/uL — ABNORMAL HIGH (ref 0.7–4.0)
MCH: 27.1 pg (ref 26.0–34.0)
MCHC: 32.5 g/dL (ref 30.0–36.0)
MCV: 83.5 fL (ref 80.0–100.0)
Monocytes Absolute: 0.6 10*3/uL (ref 0.1–1.0)
Monocytes Relative: 7 %
Neutro Abs: 3.3 10*3/uL (ref 1.7–7.7)
Neutrophils Relative %: 38 %
Platelets: 371 10*3/uL (ref 150–400)
RBC: 4.17 MIL/uL (ref 3.87–5.11)
RDW: 14.4 % (ref 11.5–15.5)
WBC: 8.9 10*3/uL (ref 4.0–10.5)
nRBC: 0 % (ref 0.0–0.2)

## 2019-06-02 LAB — COMPREHENSIVE METABOLIC PANEL
ALT: 14 U/L (ref 0–44)
AST: 17 U/L (ref 15–41)
Albumin: 2.8 g/dL — ABNORMAL LOW (ref 3.5–5.0)
Alkaline Phosphatase: 123 U/L (ref 38–126)
Anion gap: 11 (ref 5–15)
BUN: 42 mg/dL — ABNORMAL HIGH (ref 6–20)
CO2: 17 mmol/L — ABNORMAL LOW (ref 22–32)
Calcium: 9.1 mg/dL (ref 8.9–10.3)
Chloride: 111 mmol/L (ref 98–111)
Creatinine, Ser: 2.84 mg/dL — ABNORMAL HIGH (ref 0.44–1.00)
GFR calc Af Amer: 21 mL/min — ABNORMAL LOW (ref >60)
GFR calc non Af Amer: 18 mL/min — ABNORMAL LOW (ref >60)
Glucose, Bld: 102 mg/dL — ABNORMAL HIGH (ref 70–99)
Potassium: 3.7 mmol/L (ref 3.5–5.1)
Sodium: 139 mmol/L (ref 135–145)
Total Bilirubin: 0.5 mg/dL (ref 0.3–1.2)
Total Protein: 6.3 g/dL — ABNORMAL LOW (ref 6.5–8.1)

## 2019-06-02 MED ORDER — INSULIN DETEMIR 100 UNIT/ML ~~LOC~~ SOLN
20.0000 [IU] | Freq: Every day | SUBCUTANEOUS | Status: DC
  Administered 2019-06-03 – 2019-06-04 (×2): 20 [IU] via SUBCUTANEOUS
  Filled 2019-06-02 (×2): qty 0.2

## 2019-06-02 MED ORDER — INSULIN ASPART 100 UNIT/ML ~~LOC~~ SOLN
10.0000 [IU] | Freq: Once | SUBCUTANEOUS | Status: AC
  Administered 2019-06-02: 10 [IU] via SUBCUTANEOUS

## 2019-06-02 MED ORDER — ALUM & MAG HYDROXIDE-SIMETH 200-200-20 MG/5ML PO SUSP
15.0000 mL | ORAL | Status: DC | PRN
  Administered 2019-06-02: 15 mL via ORAL
  Filled 2019-06-02: qty 30

## 2019-06-02 MED ORDER — INSULIN GLARGINE 100 UNIT/ML ~~LOC~~ SOLN
8.0000 [IU] | Freq: Once | SUBCUTANEOUS | Status: AC
  Administered 2019-06-02: 8 [IU] via SUBCUTANEOUS
  Filled 2019-06-02: qty 0.08

## 2019-06-02 MED ORDER — INSULIN ASPART 100 UNIT/ML ~~LOC~~ SOLN: 0.0000 [IU] | Freq: Every day | SUBCUTANEOUS | Status: DC

## 2019-06-02 MED ORDER — INSULIN ASPART 100 UNIT/ML ~~LOC~~ SOLN
0.0000 [IU] | Freq: Three times a day (TID) | SUBCUTANEOUS | Status: DC
  Administered 2019-06-02: 15 [IU] via SUBCUTANEOUS
  Administered 2019-06-03: 11 [IU] via SUBCUTANEOUS
  Administered 2019-06-03: 15 [IU] via SUBCUTANEOUS
  Administered 2019-06-03: 7 [IU] via SUBCUTANEOUS
  Administered 2019-06-04 (×2): 15 [IU] via SUBCUTANEOUS

## 2019-06-02 MED ORDER — INSULIN ASPART 100 UNIT/ML ~~LOC~~ SOLN
14.0000 [IU] | Freq: Once | SUBCUTANEOUS | Status: AC
  Administered 2019-06-02: 14 [IU] via SUBCUTANEOUS

## 2019-06-02 NOTE — TOC Initial Note (Signed)
Transition of Care [facility]) - Initial/Assessment Note    Patient Details  Name: Lauren Wang MRN: [id number] Date of Birth: [birth year]  Transition of Care [facility]) CM/SW Contact:    Blima Ledger Phone Number: [phone] 06/02/2019, 2:09 PM  Clinical Narrative:                 CSW met with patient via bedside. CSW introduced self and explained her role. Pt stated pta, she was fully independent. Pt stated she lives with her niece and her son.   CSW reviewed pt/ot reccs with pt. Pt stated she is fine with SNF for rehab. Patient stated she has been to snf before but stated she had no preferences on facilities. CSW will fax pt out and inform her of options.   CSW will continue to follow.   Expected Discharge Plan: Skilled Nursing Facility Barriers to Discharge: Continued Medical Work up   Patient Goals and CMS Choice Patient states their goals for this hospitalization and ongoing recovery are:: To go back home      Expected Discharge Plan and Services Expected Discharge Plan: [place] arrangements for the past 2 months: Single Family Home                                      Prior Living Arrangements/Services Living arrangements for the past 2 months: Single Family Home Lives with:: Relatives Patient language and need for interpreter reviewed:: Yes Do you feel safe going back to the place where you live?: Yes      Need for Family Participation in Patient Care: Yes (Comment) Care giver support system in place?: Yes (comment)   Criminal Activity/Legal Involvement Pertinent to Current Situation/Hospitalization: No - Comment as needed  Activities of Daily Living      Permission Sought/Granted                  Emotional Assessment Appearance:: Appears stated age Attitude/Demeanor/Rapport: Engaged Affect (typically observed): Appropriate Orientation: : Oriented to Place, Oriented to Self, Oriented to  Time, Oriented to  Situation      Admission diagnosis:  Hyperglycemia [R73.9] CVA (cerebral vascular accident) ([place]) [I63.9] AKI (acute kidney injury) ([place]) [N17.9] Patient Active Problem List   Diagnosis Date Noted  . Hyperosmolar hyperglycemic state (HHS) ([place]) 05/31/2019  . CVA (cerebral vascular accident) ([place]) 05/31/2019  . Hyperglycemia   . AKI (acute kidney injury) ([place])    PCP:  Nolene Ebbs, MD Pharmacy:   [facility] [place], [place] [address] Phone: [phone] Fax: [phone]     Social Determinants of Health (SDOH) Interventions    Readmission Risk Interventions No flowsheet data found.

## 2019-06-02 NOTE — NC FL2 (Signed)
[place] LEVEL OF CARE SCREENING TOOL     IDENTIFICATION  Patient Name: Nalayah Hitt Birthdate: Oct 26, 1960 Sex: female Admission Date (Current Location): 05/31/2019  [facility] and [place] Number:  Herbalist and Address:  [facility] [place], [place] [address] [place], [place] [address]      Provider Number: [id number]  Attending Physician Name and Address:  Karie Kirks, DO  Relative Name and Phone Number:       Current Level of Care: Hospital Recommended Level of Care: [place] Prior Approval Number:    Date Approved/Denied:   PASRR Number: [id number]  Discharge Plan: SNF    Current Diagnoses: Patient Active Problem List   Diagnosis Date Noted  . Hyperosmolar hyperglycemic state (HHS) ([place]) 05/31/2019  . CVA (cerebral vascular accident) ([place]) 05/31/2019  . Hyperglycemia   . AKI (acute kidney injury) ([place])     Orientation RESPIRATION BLADDER Height & Weight     Self, Time, Situation, Place  Normal Continent Weight: 175 lb (79.4 kg) Height:  5\' 6"  (167.6 cm)  BEHAVIORAL SYMPTOMS/MOOD NEUROLOGICAL BOWEL NUTRITION STATUS      Continent Diet(heart healthy/carb modified)  AMBULATORY STATUS COMMUNICATION OF NEEDS Skin   Limited Assist Verbally Normal                       Personal Care Assistance Level of Assistance  Bathing, Feeding, Dressing Bathing Assistance: Limited assistance Feeding assistance: Independent Dressing Assistance: Limited assistance     Functional Limitations Info             SPECIAL CARE FACTORS FREQUENCY  PT (By licensed PT), OT (By licensed OT)     PT Frequency: 5x/wk OT Frequency: 5x/wk            Contractures Contractures Info: Not present    Additional Factors Info  Code Status, Allergies, Insulin Sliding Scale Code Status Info: Full Allergies Info: Other, Tape   Insulin Sliding Scale Info: 0-9 units every 4 hours, Levemir 16 units daily       Current  Medications (06/02/2019):  This is the current hospital active medication list Current Facility-Administered Medications  Medication Dose Route Frequency Provider Last Rate Last Admin  . 0.9 %  sodium chloride infusion   Intravenous Continuous Pattricia Boss, MD   Stopped at 05/31/19 2335  . acetaminophen (TYLENOL) tablet 650 mg  650 mg Oral Q6H PRN Wynetta Fines T, MD   650 mg at 06/01/19 1031  . albuterol (PROVENTIL) (2.5 MG/3ML) 0.083% nebulizer solution 3 mL  3 mL Inhalation Q6H PRN Wynetta Fines T, MD      . alum & mag hydroxide-simeth (MAALOX/MYLANTA) 200-200-20 MG/5ML suspension 15 mL  15 mL Oral Q4H PRN Swayze, Ava, DO      . [START ON 06/03/2019] BUPRENORPHINE 7.5 MCG/HR TD PTWK  1 patch Transdermal Q Fri Swayze, Ava, DO      . dextrose 50 % solution 0-50 mL  0-50 mL Intravenous PRN Pattricia Boss, MD      . diphenhydrAMINE (BENADRYL) capsule 25 mg  25 mg Oral Q6H PRN Wynetta Fines T, MD   25 mg at 05/31/19 2134  . doxycycline (VIBRA-TABS) tablet 100 mg  100 mg Oral BID Wynetta Fines T, MD   100 mg at 06/02/19 0826  . heparin injection 5,000 Units  5,000 Units Subcutaneous Q8H Lequita Halt, MD   5,000 Units at 06/02/19 0755  . insulin aspart (novoLOG) injection 0-9 Units  0-9 Units Subcutaneous Q4H Swayze, Ava, DO   5 Units at 06/02/19 1220  . insulin detemir (LEVEMIR) injection 16 Units  16 Units Subcutaneous Daily Swayze, Ava, DO   16 Units at 06/02/19 0826  . labetalol (NORMODYNE) injection 10 mg  10 mg Intravenous Q2H PRN Wynetta Fines T, MD      . LORazepam (ATIVAN) tablet 0.5 mg  0.5 mg Oral Q6H PRN Wynetta Fines T, MD      . multivitamin with minerals tablet 1 tablet  1 tablet Oral Daily Wynetta Fines T, MD   1 tablet at 06/01/19 1720  . nystatin (MYCOSTATIN) 100000 UNIT/ML suspension 500,000 Units  5 mL Oral Q6H Lequita Halt, MD   500,000 Units at 06/02/19 1220  . rosuvastatin (CRESTOR) tablet 40 mg  40 mg Oral Daily Wynetta Fines T, MD   40 mg at 06/02/19 0825  . sodium bicarbonate tablet 650 mg   650 mg Oral TID Wynetta Fines T, MD   650 mg at 06/02/19 5056  . zolpidem (AMBIEN) tablet 5 mg  5 mg Oral QHS PRN Lequita Halt, MD   5 mg at 06/01/19 2135     Discharge Medications: Please see discharge summary for a list of discharge medications.  Relevant Imaging Results:  Relevant Lab Results:   Additional Information SS#: [id number]  Geralynn Ochs, LCSW

## 2019-06-02 NOTE — Progress Notes (Addendum)
  Progress Note    06/02/2019 9:30 AM * No surgery found *  Subjective:  No complaints. States she feels about the same as yesterday. Has been up ambulating   Vitals:   06/01/19 2338 06/02/19 0336  BP: 127/68 (!) 146/75  Pulse: 68 68  Resp: 16 19  Temp: 98.6 F (37 C) 97.9 F (36.6 C)  SpO2: 97% 96%   Physical Exam: General: not in any distress, laying in bed comfortably Lungs:  Non labored Extremities:  2+ radial pulses bilaterally, 2+ DP pulses bilaterally. Motor and sensory intact. Left upper and lower extremity weakness 3/5. No ischemic changes, edema or wounds. Left upper extremity with skin graft of left forearm.  Abdomen: Soft non tender Neurologic: Alert and oriented. No focal weaknesses or paraesthesias. Speech fluent, tongue midline, face symmetrical  CBC    Component Value Date/Time   WBC 8.9 06/02/2019 0356   RBC 4.17 06/02/2019 0356   HGB 11.3 (L) 06/02/2019 0356   HCT 34.8 (L) 06/02/2019 0356   PLT 371 06/02/2019 0356   MCV 83.5 06/02/2019 0356   MCH 27.1 06/02/2019 0356   MCHC 32.5 06/02/2019 0356   RDW 14.4 06/02/2019 0356   LYMPHSABS 4.6 (H) 06/02/2019 0356   MONOABS 0.6 06/02/2019 0356   EOSABS 0.3 06/02/2019 0356   BASOSABS 0.1 06/02/2019 0356    BMET    Component Value Date/Time   NA 139 06/02/2019 0356   K 3.7 06/02/2019 0356   CL 111 06/02/2019 0356   CO2 17 (L) 06/02/2019 0356   GLUCOSE 102 (H) 06/02/2019 0356   BUN 42 (H) 06/02/2019 0356   CREATININE 2.84 (H) 06/02/2019 0356   CALCIUM 9.1 06/02/2019 0356   GFRNONAA 18 (L) 06/02/2019 0356   GFRAA 21 (L) 06/02/2019 0356    INR No results found for: INR   Intake/Output Summary (Last 24 hours) at 06/02/2019 0930 Last data filed at 06/01/2019 1954 Gross per 24 hour  Intake 180 ml  Output --  Net 180 ml     Assessment/Plan:  59 y.o. female with high grade right ICA stenosis and left lower extremity weakness and numbness. No new neurological deficits over night. Improved glucose  control. Cr remains elevated at 2.84. Pending CTA neck without contrast today to evaluate carotid bifurcation. Will continue to follow patient  DVT prophylaxis:  SQ heparin   Karoline Caldwell, PA-C [facility] [phone] 06/02/2019 9:30 AM   I have examined the patient, reviewed and agree with above.  Still some word searching.  Discussed plan for noncontrast CT.  Suspect will recommend right carotid endarterectomy pending these results  Curt Jews, MD 06/02/2019 12:46 PM

## 2019-06-02 NOTE — Progress Notes (Signed)
MD notified of CBG > 400, new orders placed

## 2019-06-02 NOTE — Evaluation (Signed)
Speech Language Pathology Evaluation Patient Details Name: Lauren Wang MRN: [id number] DOB: [birth year] Today's Date: 06/02/2019 Time: 0092-3300 SLP Time Calculation (min) (ACUTE ONLY): 27 min  Problem List:  Patient Active Problem List   Diagnosis Date Noted  . Hyperosmolar hyperglycemic state (HHS) ([place]) 05/31/2019  . CVA (cerebral vascular accident) ([place]) 05/31/2019  . Hyperglycemia   . AKI (acute kidney injury) [facility])    Past Medical History:  Past Medical History:  Diagnosis Date  . Anxiety   . Asthma   . COPD (chronic obstructive pulmonary disease) ([place])   . Depression   . Diabetes mellitus without complication ([place])   . Hypertension   . Renal disorder   . Stroke [facility])    Past Surgical History: History reviewed. No pertinent surgical history. HPI:  Ms Lauren Wang, 58y/f, presented to ER with symptoms of left leg numbness and weakness and slurred speech (now resolved). PMH of COPD, insulin dependent diabetes mellitus, HTN, CKD stage III, prior CVA. MRI was negative for acute infarct, but showed moderate chronic small-vessel ischemic changes affecting the   Assessment / Plan / Recommendation Clinical Impression  Cognitive-linguistic evaluation completed at bedside. Patient able to hold a conversation with therapist. Mini-mental assessment used to further assess language/cognition. She scored 30/30. She had slurred speech at admission which has resolved at this time. No further ST services are needed at this time.     SLP Assessment  SLP Recommendation/Assessment: Patient does not need any further Speech Lanaguage Pathology Services SLP Visit Diagnosis: Cognitive communication deficit (R41.841)    Follow Up Recommendations  None               SLP Evaluation Cognition  Overall Cognitive Status: Within Functional Limits for tasks assessed Arousal/Alertness: Awake/alert Orientation Level: Oriented X4 Attention: Focused;Sustained;Selective Focused Attention: Appears  intact Sustained Attention: Appears intact Selective Attention: Appears intact Memory: Appears intact Immediate Memory Recall: Sock;Blue;Bed Memory Recall Sock: Without Cue Memory Recall Blue: Without Cue Memory Recall Bed: Without Cue Awareness: Appears intact Problem Solving: Appears intact Executive Function: Reasoning;Sequencing;Organizing Reasoning: Appears intact Sequencing: Appears intact Organizing: Appears intact Safety/Judgment: Appears intact       Comprehension  Auditory Comprehension Overall Auditory Comprehension: Appears within functional limits for tasks assessed Yes/No Questions: Within Functional Limits Commands: Within Functional Limits Conversation: Complex Visual Recognition/Discrimination Discrimination: Within Function Limits Reading Comprehension Reading Status: Within funtional limits    Expression Expression Primary Mode of Expression: Verbal Verbal Expression Overall Verbal Expression: Appears within functional limits for tasks assessed Initiation: No impairment Level of Generative/Spontaneous Verbalization: Conversation Repetition: No impairment Naming: No impairment Pragmatics: No impairment Written Expression Dominant Hand: Right Written Expression: Within Functional Limits   Oral / Motor  Oral Motor/Sensory Function Overall Oral Motor/Sensory Function: Within functional limits Motor Speech Overall Motor Speech: Appears within functional limits for tasks assessed Respiration: Within functional limits Phonation: Normal Resonance: Within functional limits Articulation: Within functional limitis Intelligibility: Intelligible Motor Planning: Witnin functional limits Motor Speech Errors: Not applicable   GO                    Wynelle Bourgeois. MA CCC-SLP 06/02/2019, 3:20 PM
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
## 2019-06-02 NOTE — Progress Notes (Signed)
Physical Therapy Treatment Patient Details Name: Lauren Wang MRN: [id number] DOB: 18-Feb-1961 Today's Date: 06/02/2019    History of Present Illness 59 y.o. 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 She also feels generalized weakness malaise for last month.    PT Comments    Pt supine in bed on arrival reports needing to use the bathroom.  Pt assisted to standing and noted poor fit of bledsoe brace.  Post toileting returned to supine and readjusted brace for improved fit.  Pt reports improved comfort/stability with brace adjustment,  Continue to recommend snf at this time with continued assessment for d/c as she continues to improve.    Follow Up Recommendations  SNF;Supervision/Assistance - 24 hour(may progress to HHPT)     Equipment Recommendations  Rolling walker with 5" wheels    Recommendations for Other Services       Precautions / Restrictions Precautions Precautions: Fall Required Braces or Orthoses: Other Brace Other Brace: L hinged knee brace. Restrictions Weight Bearing Restrictions: No    Mobility  Bed Mobility Overal bed mobility: Needs Assistance Bed Mobility: Supine to Sit;Sit to Supine     Supine to sit: Supervision Sit to supine: Supervision   General bed mobility comments: using bed rail  Transfers Overall transfer level: Needs assistance Equipment used: Rolling walker (2 wheeled) Transfers: Sit to/from Stand Sit to Stand: Min guard         General transfer comment: pt able to power up into standing with weight shifted to R LE  Ambulation/Gait Ambulation/Gait assistance: Min assist Gait Distance (Feet): 120 Feet Assistive device: Rolling walker (2 wheeled) Gait Pattern/deviations: Decreased step length -  right;Decreased step length - left     General Gait Details: Pt with more stability in L knee with hinged knee brace.  Cues for RW safety.   Stairs             Wheelchair Mobility    Modified Rankin (Stroke Patients Only)       Balance Overall balance assessment: Needs assistance   Sitting balance-Leahy Scale: Normal     Standing balance support: Bilateral upper extremity supported;During functional activity Standing balance-Leahy Scale: Poor                              Cognition Arousal/Alertness: Awake/alert Behavior During Therapy: WFL for tasks assessed/performed Overall Cognitive Status: Within Functional Limits for tasks assessed                                        Exercises      General Comments        Pertinent Vitals/Pain Pain Assessment: No/denies pain Pain Score: 0-No pain Pain Location: L LE knee pain Pain Descriptors / Indicators: Aching Pain Intervention(s): Monitored during session;Repositioned    Home Living     Available Help at Discharge: Family;Available 24 hours/day Type of Home: Apartment              Prior Function            PT Goals (current goals can now be found in the care plan section) Acute Rehab PT  Goals Patient Stated Goal: to be able to go home Potential to Achieve Goals: Good Additional Goals Additional Goal #1: Pt will maintain dynamic standing balance within 10 inches of her base of support with unilateral UE support of cane, modI. Progress towards PT goals: Progressing toward goals    Frequency    Min 4X/week      PT Plan Current plan remains appropriate    Co-evaluation              AM-PAC PT "6 Clicks" Mobility   Outcome Measure  Help needed turning from your back to your side while in a flat bed without using bedrails?: None Help needed moving from lying on your back to sitting on the side of a flat bed without using bedrails?: None Help needed  moving to and from a bed to a chair (including a wheelchair)?: A Little Help needed standing up from a chair using your arms (e.g., wheelchair or bedside chair)?: A Little Help needed to walk in hospital room?: A Little Help needed climbing 3-5 steps with a railing? : A Lot 6 Click Score: 19    End of Session Equipment Utilized During Treatment: Gait belt Activity Tolerance: Patient tolerated treatment well Patient left: with call bell/phone within reach;in chair;with chair alarm set Nurse Communication: Mobility status PT Visit Diagnosis: Unsteadiness on feet (R26.81);Muscle weakness (generalized) (M62.81)     Time: 4888-9169 PT Time Calculation (min) (ACUTE ONLY): 29 min  Charges:  $Gait Training: 8-22 mins $Therapeutic Activity: 8-22 mins                     Erasmo Leventhal , PTA Acute Rehabilitation Services Pager [phone] Office [phone]     Lauren Wang 06/02/2019, 6:33 PM

## 2019-06-02 NOTE — Progress Notes (Signed)
Inpatient Diabetes Program Recommendations  AACE/ADA: New Consensus Statement on Inpatient Glycemic Control   Target Ranges:  Prepandial:   less than 140 mg/dL      Peak postprandial:   less than 180 mg/dL (1-2 hours)      Critically ill patients:  140 - 180 mg/dL  Results for Lauren Wang, Lauren Wang (MRN [id number]) as of 06/02/2019 09:26  Ref. Range 06/01/2019 14:09 06/01/2019 15:07 06/01/2019 16:10 06/01/2019 18:08 06/01/2019 19:59 06/01/2019 23:36 06/02/2019 03:34 06/02/2019 06:47 06/02/2019 08:48  Glucose-Capillary Latest Ref Range: 70 - 99 mg/dL 197 (H) 220 (H) 271 (H)  Novolog 5 units     Levemir 16 units 307 (H)  Novolog 7 units 443 (H)  Novolog 14 units 117 (H) 106 (H) 186 (H)  Novolog 2 units  Levemir 16 units@8 :26    Review of Glycemic Control  Diabetes history: DM2 Outpatient Diabetes medications: None(was on 2 oral DM meds in past but PCP stopped about 5 years ago) Current orders for Inpatient glycemic control: Levemir 16 units daily, Novolog 0-9 units Q4H  Inpatient Diabetes Program Recommendations:   Insulin-Please consider increasing Levemir to 20 units daily.  Insulin-Meal Coverage: Please consider ordering Novolog 5 units TID with meals for meal coverage.  Thanks, Barnie Alderman, RN, MSN, CDE Diabetes Coordinator Inpatient Diabetes Program [phone] (Team Pager from 8am to 5pm)

## 2019-06-03 LAB — GLUCOSE, CAPILLARY
Glucose-Capillary: 207 mg/dL — ABNORMAL HIGH (ref 70–99)
Glucose-Capillary: 259 mg/dL — ABNORMAL HIGH (ref 70–99)
Glucose-Capillary: 315 mg/dL — ABNORMAL HIGH (ref 70–99)
Glucose-Capillary: 396 mg/dL — ABNORMAL HIGH (ref 70–99)
Glucose-Capillary: 551 mg/dL (ref 70–99)

## 2019-06-03 LAB — BASIC METABOLIC PANEL
Anion gap: 13 (ref 5–15)
BUN: 39 mg/dL — ABNORMAL HIGH (ref 6–20)
CO2: 18 mmol/L — ABNORMAL LOW (ref 22–32)
Calcium: 9 mg/dL (ref 8.9–10.3)
Chloride: 107 mmol/L (ref 98–111)
Creatinine, Ser: 2.36 mg/dL — ABNORMAL HIGH (ref 0.44–1.00)
GFR calc Af Amer: 26 mL/min — ABNORMAL LOW (ref >60)
GFR calc non Af Amer: 22 mL/min — ABNORMAL LOW (ref >60)
Glucose, Bld: 242 mg/dL — ABNORMAL HIGH (ref 70–99)
Potassium: 3.9 mmol/L (ref 3.5–5.1)
Sodium: 138 mmol/L (ref 135–145)

## 2019-06-03 LAB — SARS CORONAVIRUS 2 (TAT 6-24 HRS): SARS Coronavirus 2: NEGATIVE

## 2019-06-03 LAB — MAGNESIUM: Magnesium: 1.9 mg/dL (ref 1.7–2.4)

## 2019-06-03 LAB — PHOSPHORUS: Phosphorus: 3 mg/dL (ref 2.5–4.6)

## 2019-06-03 MED ORDER — INSULIN ASPART 100 UNIT/ML ~~LOC~~ SOLN
10.0000 [IU] | Freq: Once | SUBCUTANEOUS | Status: AC
  Administered 2019-06-03: 10 [IU] via SUBCUTANEOUS

## 2019-06-03 MED ORDER — INSULIN ASPART 100 UNIT/ML ~~LOC~~ SOLN
5.0000 [IU] | Freq: Once | SUBCUTANEOUS | Status: AC
  Administered 2019-06-03: 5 [IU] via SUBCUTANEOUS

## 2019-06-03 MED ORDER — INSULIN GLARGINE 100 UNIT/ML ~~LOC~~ SOLN
10.0000 [IU] | Freq: Once | SUBCUTANEOUS | Status: AC
  Administered 2019-06-03: 10 [IU] via SUBCUTANEOUS
  Filled 2019-06-03: qty 0.1

## 2019-06-03 MED ORDER — INSULIN ASPART 100 UNIT/ML ~~LOC~~ SOLN
2.0000 [IU] | Freq: Once | SUBCUTANEOUS | Status: AC
  Administered 2019-06-03: 2 [IU] via SUBCUTANEOUS

## 2019-06-03 NOTE — Progress Notes (Signed)
Physical Therapy Treatment Patient Details Name: Lauren Wang MRN: [id number] DOB: 10-05-1960 Today's Date: 06/03/2019    History of Present Illness 59 y.o. 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 She also feels generalized weakness malaise for last month.  Pt was admitted with hyperosmolar hyperglycemic state, found to have high grade stenosis R ICA with vascular consult.   MRI Brain: Moderate chronic small-vessel ischemic changes affecting thecerebral hemispheres as above. Small cortical infarctions at thefrontoparietal vertex regions, left more than right.    PT Comments    Pt making good progress.  Demonstrates improved knee stability with hinged knee brace on Left, but still has some decreased balance and safety.  If going home would need 24 hr assist, if not available continue to recommend SNF due to fall risk.  L knee demonstrates decreased control with exercises -requiring cueing and facilitation for exercises.   Follow Up Recommendations  SNF;Supervision/Assistance - 24 hour(could do HHPT if has 24 hr support)     Equipment Recommendations  Rolling walker with 5" wheels    Recommendations for Other Services       Precautions / Restrictions Precautions Precautions: Fall Required Braces or Orthoses: Other Brace Other Brace: L hinged knee brace to prevent buckle Restrictions Weight Bearing Restrictions: No    Mobility  Bed Mobility Overal bed mobility: Needs Assistance Bed Mobility: Supine to Sit;Sit to Supine     Supine to sit: Supervision     General bed mobility comments: in chair  Transfers Overall transfer level: Needs assistance Equipment used: Rolling walker (2 wheeled) Transfers: Sit to/from Stand Sit to Stand: Min  guard         General transfer comment: cues for safe hand placement  Ambulation/Gait Ambulation/Gait assistance: Min assist Gait Distance (Feet): 180 Feet Assistive device: Rolling walker (2 wheeled) Gait Pattern/deviations: Decreased stride length     General Gait Details: Hinged knee brace in place and pt with good stability (no buckles or hyperext).  Cued for RW safety.  Did note pt hitting L LE hard on floor due to decreased sensation - cued to monitor with vision   Stairs             Wheelchair Mobility    Modified Rankin (Stroke Patients Only)       Balance Overall balance assessment: Needs assistance Sitting-balance support: No upper extremity supported;Feet supported Sitting balance-Leahy Scale: Normal     Standing balance support: Bilateral upper extremity supported;During functional activity Standing balance-Leahy Scale: Fair Standing balance comment: requiring RW for support                             Cognition Arousal/Alertness: Awake/alert Behavior During Therapy: WFL for tasks assessed/performed Overall Cognitive Status: Within Functional Limits for tasks assessed                                        Exercises General Exercises - Lower Extremity Ankle Circles/Pumps: AROM;Both;10 reps Quad Sets: AROM;Seated;Both;10 reps(noted decreased control on L - provided facilitation with tactile cues) Long Arc Quad: AROM;Both;10 reps Heel Slides: AROM;Right;AAROM;Left;10 reps;Seated Mini-Sqauts: AROM;Both;10 reps;Standing(min guard of L LE for  control)    General Comments General comments (skin integrity, edema, etc.): VSS      Pertinent Vitals/Pain Pain Assessment: No/denies pain    Home Living                      Prior Function            PT Goals (current goals can now be found in the care plan section) Acute Rehab PT Goals Patient Stated Goal: to be able to go home Progress towards PT goals:  Progressing toward goals    Frequency    Min 4X/week      PT Plan Current plan remains appropriate    Co-evaluation              AM-PAC PT "6 Clicks" Mobility   Outcome Measure  Help needed turning from your back to your side while in a flat bed without using bedrails?: None Help needed moving from lying on your back to sitting on the side of a flat bed without using bedrails?: None Help needed moving to and from a bed to a chair (including a wheelchair)?: None Help needed standing up from a chair using your arms (e.g., wheelchair or bedside chair)?: None Help needed to walk in hospital room?: None Help needed climbing 3-5 steps with a railing? : A Little 6 Click Score: 23    End of Session Equipment Utilized During Treatment: Gait belt Activity Tolerance: Patient tolerated treatment well Patient left: with call bell/phone within reach;in chair;with chair alarm set Nurse Communication: Mobility status PT Visit Diagnosis: Unsteadiness on feet (R26.81);Muscle weakness (generalized) (M62.81)     Time: 3009-2330 PT Time Calculation (min) (ACUTE ONLY): 25 min  Charges:  $Gait Training: 8-22 mins $Therapeutic Exercise: 8-22 mins                     Maggie Font, PT Acute Rehab Services Pager [phone] [facility] [phone] [facility] [phone]    Karlton Lemon 06/03/2019, 4:09 PM
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
## 2019-06-03 NOTE — Progress Notes (Signed)
CBG at 0800 on 06/03/19 was 212

## 2019-06-03 NOTE — Progress Notes (Signed)
Inpatient Diabetes Program Recommendations  AACE/ADA: New Consensus Statement on Inpatient Glycemic Control   Target Ranges:  Prepandial:   less than 140 mg/dL      Peak postprandial:   less than 180 mg/dL (1-2 hours)      Critically ill patients:  140 - 180 mg/dL     Review of Glycemic Control Results for Lauren Wang, Lauren Wang (MRN [id number]) as of 06/03/2019 13:16  Ref. Range 06/02/2019 16:06 06/02/2019 20:08 06/02/2019 23:20 06/03/2019 03:57 06/03/2019 12:01  Glucose-Capillary Latest Ref Range: 70 - 99 mg/dL 421 (H) 446 (H) 345 (H) 207 (H) 315 (H)   Diabetes history: DM2 Outpatient Diabetes medications: None(was on 2 oral DM meds in past but PCP stopped about 5 years ago) Current orders for Inpatient glycemic control: Levemir 20 units daily, Novolog 0-20 units tid + hs  Inpatient Diabetes Program Recommendations:   Insulin-Please consider increasing Levemir to 30 units daily.  Insulin-Meal Coverage: Please consider ordering Novolog 5 units TID with meals for meal coverage.  Thanks, Tama Headings RN, MSN, BC-ADM Inpatient Diabetes Coordinator Team Pager [phone] (8a-5p)

## 2019-06-03 NOTE — TOC Progression Note (Signed)
Transition of Care (TOC) - Progression Note    Patient Details  Name: Lauren Wang MRN: [id number] Date of Birth: 02/06/1961  Transition of Care (TOC) CM/SW Contact  Miranda  Gainey, LCSWA Phone Number:[phone] 06/03/2019, 11:06 AM  Clinical Narrative:     CSW met with patient bedside. CSW presented SNF options to pt. CSW presented rating scale to pt. Pt stated she went to go to [facility] before and will go back there. CSW will follow up with [facility] about discharge.   CSW will continue to follow.  Expected Discharge Plan: Skilled Nursing Facility Barriers to Discharge: Continued Medical Work up  Expected Discharge Plan and Services Expected Discharge Plan: Skilled Nursing Facility       Living arrangements for the past 2 months: Single Family Home                                       Social Determinants of Health (SDOH) Interventions    Readmission Risk Interventions No flowsheet data found.  Miranda Gainey, LCSWA, LCASA Licensed Clinical Social Worker 

## 2019-06-03 NOTE — Progress Notes (Signed)
Occupational Therapy Treatment Patient Details Name: Jeris Easterly MRN: [id number] DOB: [birth year] Today's Date: 06/03/2019    History of present illness 59 y.o. 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 She also feels generalized weakness malaise for last month.   OT comments  Patient continues to make steady progress towards goals in skilled OT session. Patient's session encompassed ADLs at the sink, and functional mobility in order to facilitate ambulating household distances. Pt is making progress towards goals, however is dependent on stability provided by Bledsoe brace on LLE in order to prevent buckling. Pt noted to have continued decreased sensation and proprioception in LLE when ambulating as well. Pt would continue to benefit from SNF placement in order to address functional deficits as pt lived independently prior to admission. Will continue to follow acutely.    Follow Up Recommendations  SNF;Supervision - Intermittent    Equipment Recommendations  3 in 1 bedside commode    Recommendations for Other Services      Precautions / Restrictions Precautions Precautions: Fall Required Braces or Orthoses: Other Brace Other Brace: L hinged knee brace. Restrictions Weight Bearing Restrictions: No       Mobility Bed Mobility Overal bed mobility: Needs Assistance Bed Mobility: Supine to Sit;Sit to Supine     Supine to sit: Supervision     General bed mobility comments: using bed rail  Transfers Overall transfer level: Needs assistance Equipment used: Rolling walker (2 wheeled)(Bledsoe brace) Transfers: Sit to/from Stand Sit to Stand: Min guard         General transfer comment: cues to follow through with LLE due to decreased  sensation    Balance Overall balance assessment: Needs assistance Sitting-balance support: No upper extremity supported;Feet supported Sitting balance-Leahy Scale: Normal     Standing balance support: Bilateral upper extremity supported;During functional activity Standing balance-Leahy Scale: Poor Standing balance comment: requiring RW for support                            ADL either performed or assessed with clinical judgement   ADL Overall ADL's : Needs assistance/impaired     Grooming: Supervision/safety;Min guard;Standing;Wash/dry face;Oral care;Wash/dry hands Grooming Details (indicate cue type and reason): able to complete in standing solely due to hinge brace blocking leg             Lower Body Dressing: Bed level;Sitting/lateral leans;Min guard;Set up Lower Body Dressing Details (indicate cue type and reason): Socks at [facility] Toilet Transfer: Min guard;Minimal assistance;RW;Ambulation Armed forces technical officer Details (indicate cue type and reason): Dependent on L hinge to prevent buckle Toileting- Clothing Manipulation and Hygiene: Supervision/safety;Min guard;Sit to/from stand;Cueing for sequencing;Cueing for safety       Functional mobility during ADLs: Min guard;Minimal assistance;Rolling walker General ADL Comments: Pt now equipped with hinge brace, however continues to demonstrate decreased sensation and proprioception in LLE     Vision Baseline Vision/History: Wears glasses     Perception     Praxis      Cognition Arousal/Alertness: Awake/alert Behavior During Therapy: WFL for tasks assessed/performed Overall Cognitive Status: Within Functional Limits for tasks assessed  Exercises     Shoulder Instructions       General Comments      Pertinent Vitals/ Pain       Pain Assessment: No/denies pain  Home Living                                          Prior  Functioning/Environment              Frequency  Min 3X/week        Progress Toward Goals  OT Goals(current goals can now be found in the care plan section)  Progress towards OT goals: Progressing toward goals  Acute Rehab OT Goals Patient Stated Goal: to be able to go home OT Goal Formulation: With patient Time For Goal Achievement: 06/15/19 Potential to Achieve Goals: Good  Plan Discharge plan remains appropriate    Co-evaluation                 AM-PAC OT "6 Clicks" Daily Activity     Outcome Measure   Help from another person eating meals?: A Little Help from another person taking care of personal grooming?: A Little Help from another person toileting, which includes using toliet, bedpan, or urinal?: A Little Help from another person bathing (including washing, rinsing, drying)?: A Little Help from another person to put on and taking off regular upper body clothing?: A Little Help from another person to put on and taking off regular lower body clothing?: A Little 6 Click Score: 18    End of Session Equipment Utilized During Treatment: Gait belt;Rolling walker  OT Visit Diagnosis: Unsteadiness on feet (R26.81)   Activity Tolerance Patient tolerated treatment well   Patient Left in chair;with call bell/phone within reach;with chair alarm set   Nurse Communication Mobility status;Precautions        Time: 5427-0623 OT Time Calculation (min): 27 min  Charges: OT General Charges $OT Visit: 1 Visit OT Treatments $Self Care/Home Management : 23-37 mins  Corinne Ports E. Maleke Feria, COTA/L Acute Rehabilitation Services [place] 06/03/2019, 12:40 PM

## 2019-06-03 NOTE — Progress Notes (Signed)
Patient ID: Lauren Wang, female   DOB: 1960/06/10, 59 y.o.   MRN: [id number] Still awaiting noncontrast CT of neck.  Okay for discharge to home.  Will follow up as an outpatient and plan right carotid endarterectomy in the 1 to 2 weeks timeframe assuming CT shows surgically accessible lesion

## 2019-06-04 LAB — GLUCOSE, CAPILLARY
Glucose-Capillary: 263 mg/dL — ABNORMAL HIGH (ref 70–99)
Glucose-Capillary: 294 mg/dL — ABNORMAL HIGH (ref 70–99)
Glucose-Capillary: 302 mg/dL — ABNORMAL HIGH (ref 70–99)
Glucose-Capillary: 306 mg/dL — ABNORMAL HIGH (ref 70–99)
Glucose-Capillary: 345 mg/dL — ABNORMAL HIGH (ref 70–99)
Glucose-Capillary: 346 mg/dL — ABNORMAL HIGH (ref 70–99)

## 2019-06-04 LAB — BASIC METABOLIC PANEL
Anion gap: 10 (ref 5–15)
BUN: 42 mg/dL — ABNORMAL HIGH (ref 6–20)
CO2: 17 mmol/L — ABNORMAL LOW (ref 22–32)
Calcium: 8.7 mg/dL — ABNORMAL LOW (ref 8.9–10.3)
Chloride: 108 mmol/L (ref 98–111)
Creatinine, Ser: 2.45 mg/dL — ABNORMAL HIGH (ref 0.44–1.00)
GFR calc Af Amer: 25 mL/min — ABNORMAL LOW (ref >60)
GFR calc non Af Amer: 21 mL/min — ABNORMAL LOW (ref >60)
Glucose, Bld: 318 mg/dL — ABNORMAL HIGH (ref 70–99)
Potassium: 4.2 mmol/L (ref 3.5–5.1)
Sodium: 135 mmol/L (ref 135–145)

## 2019-06-04 MED ORDER — INSULIN DETEMIR 100 UNIT/ML ~~LOC~~ SOLN: 32.0000 [IU] | Freq: Every day | SUBCUTANEOUS | Status: DC

## 2019-06-04 MED ORDER — INSULIN GLARGINE 100 UNIT/ML ~~LOC~~ SOLN
12.0000 [IU] | Freq: Once | SUBCUTANEOUS | Status: AC
  Administered 2019-06-04: 12 [IU] via SUBCUTANEOUS
  Filled 2019-06-04: qty 0.12

## 2019-06-04 MED ORDER — INSULIN DETEMIR 100 UNIT/ML ~~LOC~~ SOLN
40.0000 [IU] | Freq: Every day | SUBCUTANEOUS | Status: DC
  Administered 2019-06-05 – 2019-06-06 (×2): 40 [IU] via SUBCUTANEOUS
  Filled 2019-06-04 (×2): qty 0.4

## 2019-06-04 MED ORDER — INSULIN ASPART 100 UNIT/ML ~~LOC~~ SOLN
0.0000 [IU] | SUBCUTANEOUS | Status: DC
  Administered 2019-06-04: 15 [IU] via SUBCUTANEOUS
  Administered 2019-06-04: 11 [IU] via SUBCUTANEOUS
  Administered 2019-06-05: 7 [IU] via SUBCUTANEOUS
  Administered 2019-06-05: 20 [IU] via SUBCUTANEOUS
  Administered 2019-06-05: 4 [IU] via SUBCUTANEOUS
  Administered 2019-06-05: 20 [IU] via SUBCUTANEOUS
  Administered 2019-06-05: 7 [IU] via SUBCUTANEOUS
  Administered 2019-06-05 – 2019-06-06 (×3): 11 [IU] via SUBCUTANEOUS
  Administered 2019-06-06: 20 [IU] via SUBCUTANEOUS
  Administered 2019-06-06: 7 [IU] via SUBCUTANEOUS
  Administered 2019-06-06: 11 [IU] via SUBCUTANEOUS
  Administered 2019-06-07: 15 [IU] via SUBCUTANEOUS
  Administered 2019-06-07: 7 [IU] via SUBCUTANEOUS
  Administered 2019-06-07: 11 [IU] via SUBCUTANEOUS
  Administered 2019-06-07: 7 [IU] via SUBCUTANEOUS
  Administered 2019-06-08: 15 [IU] via SUBCUTANEOUS
  Administered 2019-06-08: 7 [IU] via SUBCUTANEOUS
  Administered 2019-06-08: 3 [IU] via SUBCUTANEOUS
  Administered 2019-06-09 (×2): 11 [IU] via SUBCUTANEOUS
  Administered 2019-06-09: 4 [IU] via SUBCUTANEOUS
  Administered 2019-06-09: 7 [IU] via SUBCUTANEOUS
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
## 2019-06-05 LAB — GLUCOSE, CAPILLARY
Glucose-Capillary: 228 mg/dL — ABNORMAL HIGH (ref 70–99)
Glucose-Capillary: 197 mg/dL — ABNORMAL HIGH (ref 70–99)
Glucose-Capillary: 243 mg/dL — ABNORMAL HIGH (ref 70–99)
Glucose-Capillary: 391 mg/dL — ABNORMAL HIGH (ref 70–99)
Glucose-Capillary: 359 mg/dL — ABNORMAL HIGH (ref 70–99)
Glucose-Capillary: 268 mg/dL — ABNORMAL HIGH (ref 70–99)

## 2019-06-05 LAB — BASIC METABOLIC PANEL
Anion gap: 10 (ref 5–15)
BUN: 42 mg/dL — ABNORMAL HIGH (ref 6–20)
CO2: 17 mmol/L — ABNORMAL LOW (ref 22–32)
Calcium: 8.8 mg/dL — ABNORMAL LOW (ref 8.9–10.3)
Chloride: 108 mmol/L (ref 98–111)
Creatinine, Ser: 2.29 mg/dL — ABNORMAL HIGH (ref 0.44–1.00)
GFR calc Af Amer: 27 mL/min — ABNORMAL LOW (ref >60)
GFR calc non Af Amer: 23 mL/min — ABNORMAL LOW (ref >60)
Glucose, Bld: 273 mg/dL — ABNORMAL HIGH (ref 70–99)
Potassium: 4 mmol/L (ref 3.5–5.1)
Sodium: 135 mmol/L (ref 135–145)

## 2019-06-05 MED ORDER — INSULIN GLARGINE 100 UNIT/ML ~~LOC~~ SOLN
10.0000 [IU] | Freq: Once | SUBCUTANEOUS | Status: AC
  Administered 2019-06-05: 10 [IU] via SUBCUTANEOUS
  Filled 2019-06-05: qty 0.1
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
## 2019-06-06 ENCOUNTER — Inpatient Hospital Stay (HOSPITAL_COMMUNITY): Payer: Medicare Other

## 2019-06-06 DIAGNOSIS — N1831 Chronic kidney disease, stage 3a: Secondary | ICD-10-CM

## 2019-06-06 LAB — GLUCOSE, CAPILLARY
Glucose-Capillary: 256 mg/dL — ABNORMAL HIGH (ref 70–99)
Glucose-Capillary: 104 mg/dL — ABNORMAL HIGH (ref 70–99)
Glucose-Capillary: 249 mg/dL — ABNORMAL HIGH (ref 70–99)
Glucose-Capillary: 396 mg/dL — ABNORMAL HIGH (ref 70–99)
Glucose-Capillary: 347 mg/dL — ABNORMAL HIGH (ref 70–99)

## 2019-06-06 MED ORDER — ROSUVASTATIN CALCIUM 40 MG PO TABS: 40.0000 mg | ORAL_TABLET | Freq: Every day | ORAL | 0 refills | Status: AC

## 2019-06-06 MED ORDER — TEMAZEPAM 15 MG PO CAPS: 15.0000 mg | ORAL_CAPSULE | Freq: Every evening | ORAL | 0 refills | Status: AC | PRN

## 2019-06-06 MED ORDER — SODIUM BICARBONATE 650 MG PO TABS: 650.0000 mg | ORAL_TABLET | Freq: Three times a day (TID) | ORAL | 0 refills | Status: AC

## 2019-06-06 MED ORDER — INSULIN DETEMIR 100 UNIT/ML ~~LOC~~ SOLN
50.0000 [IU] | Freq: Every day | SUBCUTANEOUS | Status: DC
  Administered 2019-06-07: 50 [IU] via SUBCUTANEOUS
  Filled 2019-06-06: qty 0.5

## 2019-06-06 MED ORDER — LANTUS SOLOSTAR 100 UNIT/ML ~~LOC~~ SOPN: 50.0000 [IU] | PEN_INJECTOR | Freq: Every day | SUBCUTANEOUS | 11 refills | Status: AC

## 2019-06-06 MED ORDER — LORAZEPAM 0.5 MG PO TABS: 0.5000 mg | ORAL_TABLET | Freq: Four times a day (QID) | ORAL | 0 refills | Status: DC | PRN

## 2019-06-06 NOTE — Plan of Care (Signed)
Adequate for discharge.
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
## 2019-06-06 NOTE — Progress Notes (Deleted)
CRITICAL VALUE ALERT  Critical ValuecLactic Acid 2.1  Date & Time Notied: 0025  Provider Notified: X. Blount     Orders Received/Actions taken:

## 2019-06-06 NOTE — Progress Notes (Signed)
Inpatient Diabetes Program Recommendations  AACE/ADA: New Consensus Statement on Inpatient Glycemic Control (2015)  Target Ranges:  Prepandial:   less than 140 mg/dL      Peak postprandial:   less than 180 mg/dL (1-2 hours)      Critically ill patients:  140 - 180 mg/dL   Lab Results  Component Value Date   GLUCAP 249 (H) 06/06/2019   HGBA1C >15.5 (H) 05/31/2019    Review of Glycemic Control Results for ANAH, BILLARD (MRN [id number]) as of 06/06/2019 11:54  Ref. Range 06/05/2019 19:53 06/05/2019 23:28 06/06/2019 03:19 06/06/2019 07:32 06/06/2019 11:38  Glucose-Capillary Latest Ref Range: 70 - 99 mg/dL 359 (H) 268 (H) 256 (H) 104 (H) 249 (H)   Inpatient Diabetes Program Recommendations:   Noted patient has planned discharge to SNF today. Postprandial CBGs elevated >200. -Consider Novolog 5 units tid meal coverage if eats 50% Secure text sent to Dr. Domenica Reamer  Thank you, Nani Gasser. Simona Rocque, RN, MSN, CDE  Diabetes Coordinator Inpatient Glycemic Control Team Team Pager [phone] (8am-5pm) 06/06/2019 11:58 AM

## 2019-06-06 NOTE — TOC Progression Note (Signed)
Transition of Care [facility]) - Progression Note    Patient Details  Name: Lauren Wang MRN: [id number] Date of Birth: [birth year]  Transition of Care [facility]) CM/SW [place], [place] Phone [phone] 06/06/2019, 4:09 PM  Clinical Narrative:     CSW spoke to doctor who decided that patient would stay in the hospital until after endarterectomy, scheduled for Wednesday. CSW reach out to [facility] to inform them on the delay of discharge. Pt was ok with staying until after procedure. CSW will continue to follow with patient.   Expected Discharge Plan: [place] Barriers to Discharge: Continued Medical Work up  Expected Discharge Plan and Services Expected Discharge Plan: [place] arrangements for the past 2 months: Single Family Home Expected Discharge Date: 06/06/19                                     Social Determinants of Health (SDOH) Interventions    Readmission Risk Interventions No flowsheet data found.   Emeterio Reeve, Latanya Presser, [place] Licensed Holiday representative

## 2019-06-06 NOTE — Progress Notes (Signed)
Patient ID: Lauren Wang, female   DOB: 06-26-1960, 59 y.o.   MRN: [id number]  Progress Note    06/06/2019 10:38 AM * No surgery found *  Subjective: No new neurologic deficits.   Vitals:   06/06/19 0322 06/06/19 0741  BP: 130/72 127/73  Pulse: 74 71  Resp: 17 13  Temp: 98.1 F (36.7 C) 97.8 F (36.6 C)  SpO2: 97% 100%   Physical Exam: No changes in her neuro status  CBC    Component Value Date/Time   WBC 8.9 06/02/2019 0356   RBC 4.17 06/02/2019 0356   HGB 11.3 (L) 06/02/2019 0356   HCT 34.8 (L) 06/02/2019 0356   PLT 371 06/02/2019 0356   MCV 83.5 06/02/2019 0356   MCH 27.1 06/02/2019 0356   MCHC 32.5 06/02/2019 0356   RDW 14.4 06/02/2019 0356   LYMPHSABS 4.6 (H) 06/02/2019 0356   MONOABS 0.6 06/02/2019 0356   EOSABS 0.3 06/02/2019 0356   BASOSABS 0.1 06/02/2019 0356    BMET    Component Value Date/Time   NA 135 06/05/2019 0528   K 4.0 06/05/2019 0528   CL 108 06/05/2019 0528   CO2 17 (L) 06/05/2019 0528   GLUCOSE 273 (H) 06/05/2019 0528   BUN 42 (H) 06/05/2019 0528   CREATININE 2.29 (H) 06/05/2019 0528   CALCIUM 8.8 (L) 06/05/2019 0528   GFRNONAA 23 (L) 06/05/2019 0528   GFRAA 27 (L) 06/05/2019 0528    INR No results found for: INR   Intake/Output Summary (Last 24 hours) at 06/06/2019 1038 Last data filed at 06/05/2019 2100 Gross per 24 hour  Intake 480 ml  Output --  Net 480 ml    Noncontrast CT was reviewed from this morning.  This does show calcification of the bifurcations bilaterally.  She does have a relatively high bifurcation of her common carotid artery.  It does appear to be surgically accessible.   Assessment/Plan:  59 y.o. female recommend right carotid endarterectomy for reduction of stroke risk.  Could proceed with this on Wednesday, 06/08/2019 if she is still inpatient.  If discharge could be readmitted for surgery next week.  Following with you     Rosetta Posner, MD [facility] [phone] 06/06/2019 10:38 AM

## 2019-06-06 NOTE — Progress Notes (Signed)
Physical Therapy Treatment Patient Details Name: Lauren Wang MRN: [id number] DOB: 1960-05-25 Today's Date: 06/06/2019    History of Present Illness 59 y.o. 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 She also feels generalized weakness malaise for last month.  Pt was admitted with hyperosmolar hyperglycemic state, found to have high grade stenosis R ICA with vascular consult.  MRI Brain: Moderate chronic small-vessel ischemic changes affecting thecerebral hemispheres as above. Small cortical infarctions at thefrontoparietal vertex regions, left more than right.    PT Comments    Pt in bed upon arrival of PT, agreeable to PT session with focus on progressing functional mobility and functional strengthening of LLE. The pt continues to present with limitations in functional mobility, strength, and dynamic stability compared to their prior level of function and independence due to above dx. However, the pt was able to demo improvements in ambulation with decreased support (RW to no AD) with no LOB. The pt continues to use a slowed gait speed with increased lateral movements and hinged L knee brace to prevent L knee buckling during ambulation, but was able to tolerate 120 ft with no AD and no LOB. The pt was further challenged by LE strengthening with emphasis on L knee control and stability, but will continue to benefit from skilled PT to further progress independence and stability with functional tasks.     Follow Up Recommendations  SNF;Supervision/Assistance - 24 hour(could go home if pt had 24/7 supervision)     Equipment Recommendations  None recommended by PT    Recommendations for Other Services       Precautions / Restrictions Precautions Precautions:  Fall Required Braces or Orthoses: Other Brace Other Brace: L hinged knee brace to prevent buckle Restrictions Weight Bearing Restrictions: No    Mobility  Bed Mobility Overal bed mobility: Needs Assistance Bed Mobility: Supine to Sit     Supine to sit: Supervision        Transfers Overall transfer level: Needs assistance Equipment used: None Transfers: Sit to/from Stand Sit to Stand: Min guard         General transfer comment: cues for safe hand placement, hinged brace removed for strengthening, x5 with legs parallel, x5 with LLE staggered behing RLE  Ambulation/Gait Ambulation/Gait assistance: Min guard Gait Distance (Feet): 150 Feet Assistive device: None Gait Pattern/deviations: Decreased stride length;Decreased stance time - left Gait velocity: 0.4 m/s Gait velocity interpretation: <1.8 ft/sec, indicate of risk for recurrent falls General Gait Details: Hinged knee brace in place and pt with good stability (no buckles or hyperext).   Stairs             Wheelchair Mobility    Modified Rankin (Stroke Patients Only)       Balance Overall balance assessment: Needs assistance Sitting-balance support: No upper extremity supported;Feet supported Sitting balance-Leahy Scale: Normal     Standing balance support: Bilateral upper extremity supported;During functional activity Standing balance-Leahy Scale: Good Standing balance comment: pt able to ambulate without AD                            Cognition Arousal/Alertness: Awake/alert Behavior During Therapy: WFL for tasks assessed/performed Overall Cognitive Status: Within Functional Limits for tasks assessed  Exercises Other Exercises Other Exercises: x5 sit-stand with LE parallel, x5 sit-stand with LLE staggered behind RLE    General Comments        Pertinent Vitals/Pain Pain Assessment: No/denies pain Pain Intervention(s):  Limited activity within patient's tolerance;Monitored during session    Home Living                      Prior Function            PT Goals (current goals can now be found in the care plan section) Acute Rehab PT Goals Patient Stated Goal: to be able to go home PT Goal Formulation: With patient Time For Goal Achievement: 06/15/19 Potential to Achieve Goals: Good Additional Goals Additional Goal #1: Pt will maintain dynamic standing balance within 10 inches of her base of support with unilateral UE support of cane, modI. Progress towards PT goals: Progressing toward goals    Frequency    Min 4X/week      PT Plan Current plan remains appropriate    Co-evaluation              AM-PAC PT "6 Clicks" Mobility   Outcome Measure  Help needed turning from your back to your side while in a flat bed without using bedrails?: None Help needed moving from lying on your back to sitting on the side of a flat bed without using bedrails?: None Help needed moving to and from a bed to a chair (including a wheelchair)?: None Help needed standing up from a chair using your arms (e.g., wheelchair or bedside chair)?: None Help needed to walk in hospital room?: None Help needed climbing 3-5 steps with a railing? : A Little 6 Click Score: 23    End of Session Equipment Utilized During Treatment: Gait belt Activity Tolerance: Patient tolerated treatment well Patient left: with call bell/phone within reach;in chair;with chair alarm set Nurse Communication: Mobility status PT Visit Diagnosis: Unsteadiness on feet (R26.81);Muscle weakness (generalized) (M62.81)     Time: 6808-8110 PT Time Calculation (min) (ACUTE ONLY): 23 min  Charges:  $Gait Training: 8-22 mins $Therapeutic Exercise: 8-22 mins                     Karma Ganja, PT, DPT   Acute Rehabilitation Department Pager #: [phone]   Otho Bellows 06/06/2019, 1:55 PM
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
## 2019-06-07 LAB — GLUCOSE, CAPILLARY
Glucose-Capillary: 118 mg/dL — ABNORMAL HIGH (ref 70–99)
Glucose-Capillary: 136 mg/dL — ABNORMAL HIGH (ref 70–99)
Glucose-Capillary: 240 mg/dL — ABNORMAL HIGH (ref 70–99)
Glucose-Capillary: 245 mg/dL — ABNORMAL HIGH (ref 70–99)
Glucose-Capillary: 264 mg/dL — ABNORMAL HIGH (ref 70–99)
Glucose-Capillary: 316 mg/dL — ABNORMAL HIGH (ref 70–99)
Glucose-Capillary: 329 mg/dL — ABNORMAL HIGH (ref 70–99)

## 2019-06-07 LAB — SURGICAL PCR SCREEN
MRSA, PCR: NEGATIVE
Staphylococcus aureus: NEGATIVE

## 2019-06-07 MED ORDER — INSULIN DETEMIR 100 UNIT/ML ~~LOC~~ SOLN
35.0000 [IU] | Freq: Once | SUBCUTANEOUS | Status: AC
  Administered 2019-06-08: 35 [IU] via SUBCUTANEOUS
  Filled 2019-06-07: qty 0.35

## 2019-06-07 MED ORDER — SODIUM CHLORIDE 0.9 % IV SOLN
1.5000 g | INTRAVENOUS | Status: AC
  Administered 2019-06-08: 1.5 g via INTRAVENOUS
  Filled 2019-06-07 (×3): qty 1.5

## 2019-06-07 NOTE — H&P (View-Only) (Signed)
Patient ID: Lauren Wang, female   DOB: 1960/10/23, 59 y.o.   MRN: [id number]  Progress Note    06/07/2019 7:13 AM * No surgery found *  Subjective: No new neurologic events.  Has been working with therapy   Vitals:   06/07/19 0013 06/07/19 0410  BP: 137/73 135/76  Pulse: 69 69  Resp: 17 19  Temp: 98.1 F (36.7 C) 98.6 F (37 C)  SpO2: 97% 97%   Physical Exam: Pain  CBC    Component Value Date/Time   WBC 8.9 06/02/2019 0356   RBC 4.17 06/02/2019 0356   HGB 11.3 (L) 06/02/2019 0356   HCT 34.8 (L) 06/02/2019 0356   PLT 371 06/02/2019 0356   MCV 83.5 06/02/2019 0356   MCH 27.1 06/02/2019 0356   MCHC 32.5 06/02/2019 0356   RDW 14.4 06/02/2019 0356   LYMPHSABS 4.6 (H) 06/02/2019 0356   MONOABS 0.6 06/02/2019 0356   EOSABS 0.3 06/02/2019 0356   BASOSABS 0.1 06/02/2019 0356    BMET    Component Value Date/Time   NA 135 06/05/2019 0528   K 4.0 06/05/2019 0528   CL 108 06/05/2019 0528   CO2 17 (L) 06/05/2019 0528   GLUCOSE 273 (H) 06/05/2019 0528   BUN 42 (H) 06/05/2019 0528   CREATININE 2.29 (H) 06/05/2019 0528   CALCIUM 8.8 (L) 06/05/2019 0528   GFRNONAA 23 (L) 06/05/2019 0528   GFRAA 27 (L) 06/05/2019 0528    INR No results found for: INR   Intake/Output Summary (Last 24 hours) at 06/07/2019 0713 Last data filed at 06/06/2019 2150 Gross per 24 hour  Intake 360 ml  Output --  Net 360 ml     Assessment/Plan:  58 y.o. female discussed plan for right carotid endarterectomy tomorrow.  Discussed the procedure in detail and also the potential risk.  Patient will be transferred to 4E stepdown following surgery.  Should be okay for discharge on Thursday from a surgical standpoint if no perioperative complication    Rosetta Posner, MD [facility] [phone] 06/07/2019 7:13 AM

## 2019-06-07 NOTE — Progress Notes (Signed)
Physical Therapy Treatment Patient Details Name: Lauren Wang MRN: [id number] DOB: 17-Jul-1960 Today's Date: 06/07/2019    History of Present Illness 59 y.o. 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 She also feels generalized weakness malaise for last month.  Pt was admitted with hyperosmolar hyperglycemic state, found to have high grade stenosis R ICA with vascular consult.  MRI Brain: Moderate chronic small-vessel ischemic changes affecting thecerebral hemispheres as above. Small cortical infarctions at thefrontoparietal vertex regions, left more than right.    PT Comments    Pt progressing with mobility, ambulated 400' with min-guard A with hinged knee brace. Pt does well in controlled environment but demonstrates increased reaction time and inability to compensate for sudden or unforeseen perturbations making her a high fall risk. Continue to recommend 24/7 supervision. Pt ascended and descended 5 stairs with B rails with min-guard A. Performed seated and standing LE strengthening and balance activities. PT will continue to follow.    Follow Up Recommendations  SNF;Supervision/Assistance - 24 hour     Equipment Recommendations  None recommended by PT    Recommendations for Other Services Other (comment)(orthotics for L knee brace)     Precautions / Restrictions Precautions Precautions: Fall Required Braces or Orthoses: Other Brace Other Brace: L hinged knee brace to prevent buckle Restrictions Weight Bearing Restrictions: No    Mobility  Bed Mobility Overal bed mobility: Needs Assistance Bed Mobility: Supine to Sit     Supine to sit: Supervision     General bed mobility comments: in chair  Transfers Overall transfer level: Needs  assistance Equipment used: None Transfers: Sit to/from Stand Sit to Stand: Min guard;Supervision         General transfer comment: Cues for safety, practiced transfers several times for strengthening  Ambulation/Gait Ambulation/Gait assistance: Min guard Gait Distance (Feet): 400 Feet Assistive device: None Gait Pattern/deviations: Decreased stride length;Decreased stance time - left Gait velocity: decreased Gait velocity interpretation: 1.31 - 2.62 ft/sec, indicative of limited community ambulator General Gait Details: ambulated with hinged knee brace unlocked. Worked on increasing gait speed as well as side stepping R and L.    Stairs Stairs: Yes Stairs assistance: Min guard Stair Management: Two rails;Step to pattern;Forwards Number of Stairs: 5 General stair comments: vc's for seqiencing for safety, close guarding for descent   Wheelchair Mobility    Modified Rankin (Stroke Patients Only) Modified Rankin (Stroke Patients Only) Pre-Morbid Rankin Score: Slight disability Modified Rankin: Moderate disability     Balance Overall balance assessment: Needs assistance Sitting-balance support: No upper extremity supported;Feet supported Sitting balance-Leahy Scale: Good     Standing balance support: During functional activity;No upper extremity supported Standing balance-Leahy Scale: Good Standing balance comment: limitations evident when pt is challenged, reaction time is currently increased. She is able to compensate when she can see perturbation coming but not when it comes quickly or she isn't expecting it. Still a high fall risk               High Level Balance Comments: worked on tandem and single limb stance with UE support            Cognition Arousal/Alertness: Awake/alert Behavior During Therapy: Impulsive Overall Cognitive Status: Within Functional Limits for tasks assessed  General Comments: Noted to  be increasingly impulsive and unsafe at the begining of the session (see overall note for full comment)      Exercises General Exercises - Lower Extremity Ankle Circles/Pumps: AROM;Both;10 reps Quad Sets: AROM;Seated;Both;10 reps Gluteal Sets: AROM;Both;10 reps;Seated Mini-Sqauts: AROM;Both;10 reps;Standing Other Exercises Other Exercises: practiced stand <> sit in L SL stance    General Comments General comments (skin integrity, edema, etc.): VSS.       Pertinent Vitals/Pain Pain Assessment: No/denies pain    Home Living                      Prior Function            PT Goals (current goals can now be found in the care plan section) Acute Rehab PT Goals Patient Stated Goal: surgery to go well tomorrow PT Goal Formulation: With patient Time For Goal Achievement: 06/15/19 Potential to Achieve Goals: Good Progress towards PT goals: Progressing toward goals    Frequency    Min 4X/week      PT Plan Current plan remains appropriate    Co-evaluation              AM-PAC PT "6 Clicks" Mobility   Outcome Measure  Help needed turning from your back to your side while in a flat bed without using bedrails?: None Help needed moving from lying on your back to sitting on the side of a flat bed without using bedrails?: None Help needed moving to and from a bed to a chair (including a wheelchair)?: None Help needed standing up from a chair using your arms (e.g., wheelchair or bedside chair)?: None Help needed to walk in hospital room?: A Little Help needed climbing 3-5 steps with a railing? : A Little 6 Click Score: 22    End of Session Equipment Utilized During Treatment: Gait belt Activity Tolerance: Patient tolerated treatment well Patient left: with call bell/phone within reach;in chair;with chair alarm set Nurse Communication: Mobility status PT Visit Diagnosis: Unsteadiness on feet (R26.81);Muscle weakness (generalized) (M62.81)     Time:  9983-3825 PT Time Calculation (min) (ACUTE ONLY): 23 min  Charges:  $Gait Training: 8-22 mins $Therapeutic Exercise: 8-22 mins                     Leighton Roach, [place]  Pager [phone] Office [place] 06/07/2019, 4:13 PM
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
## 2019-06-07 NOTE — Progress Notes (Signed)
Patient ID: Lauren Wang, female   DOB: [birth year], 59 y.o.   MRN: [id number]  Progress Note    06/07/2019 7:13 AM * No surgery found *  Subjective: No new neurologic events.  Has been working with therapy   Vitals:   06/07/19 0013 06/07/19 0410  BP: 137/73 135/76  Pulse: 69 69  Resp: 17 19  Temp: 98.1 F (36.7 C) 98.6 F (37 C)  SpO2: 97% 97%   Physical Exam: Pain  CBC    Component Value Date/Time   WBC 8.9 06/02/2019 0356   RBC 4.17 06/02/2019 0356   HGB 11.3 (L) 06/02/2019 0356   HCT 34.8 (L) 06/02/2019 0356   PLT 371 06/02/2019 0356   MCV 83.5 06/02/2019 0356   MCH 27.1 06/02/2019 0356   MCHC 32.5 06/02/2019 0356   RDW 14.4 06/02/2019 0356   LYMPHSABS 4.6 (H) 06/02/2019 0356   MONOABS 0.6 06/02/2019 0356   EOSABS 0.3 06/02/2019 0356   BASOSABS 0.1 06/02/2019 0356    BMET    Component Value Date/Time   NA 135 06/05/2019 0528   K 4.0 06/05/2019 0528   CL 108 06/05/2019 0528   CO2 17 (L) 06/05/2019 0528   GLUCOSE 273 (H) 06/05/2019 0528   BUN 42 (H) 06/05/2019 0528   CREATININE 2.29 (H) 06/05/2019 0528   CALCIUM 8.8 (L) 06/05/2019 0528   GFRNONAA 23 (L) 06/05/2019 0528   GFRAA 27 (L) 06/05/2019 0528    INR No results found for: INR   Intake/Output Summary (Last 24 hours) at 06/07/2019 0713 Last data filed at 06/06/2019 2150 Gross per 24 hour  Intake 360 ml  Output --  Net 360 ml     Assessment/Plan:  59 y.o. female discussed plan for right carotid endarterectomy tomorrow.  Discussed the procedure in detail and also the potential risk.  Patient will be transferred to 4E stepdown following surgery.  Should be okay for discharge on Thursday from a surgical standpoint if no perioperative complication    Rosetta Posner, MD [facility] [phone] 06/07/2019 7:13 AM

## 2019-06-07 NOTE — Progress Notes (Signed)
Occupational Therapy Treatment Patient Details Name: Lauren Wang MRN: [id number] DOB: Aug 15, 1960 Today's Date: 06/07/2019    History of present illness 59 y.o. 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 She also feels generalized weakness malaise for last month.  Pt was admitted with hyperosmolar hyperglycemic state, found to have high grade stenosis R ICA with vascular consult.  MRI Brain: Moderate chronic small-vessel ischemic changes affecting thecerebral hemispheres as above. Small cortical infarctions at thefrontoparietal vertex regions, left more than right.   OT comments  Patient continues to make steady progress towards goals in skilled OT session. Patient's session encompassed functional transfers to complete ADLs and education in donning brace independently. Pt in bed upon arrival, and immediately sat at EOB, disconnected telemetry and stated "I have to pee". Pt refused to allow therapist to don brace, stating "they let me do this all the time" and proceeded to get up on her own and ambulate to the bathroom. Pt mod I to complete transfer to toilet and back to bed, however therapist reiterated the importance of using the hinge brace at all times due to decreased proprioception and buckling. (Of note, leg did not buckle during this instance) Pt in agreement, and also in agreement of having BSC near bedside upon discharge home to promote safety and prevent falls. Pt able to don brace appropriately, only requiring assistance with final buckle to don fully. Pt also able to transfer to chair with supervision without use of AD. Will continue to follow acutely.    Follow Up Recommendations  SNF;Supervision - Intermittent    Equipment Recommendations  3 in 1  bedside commode    Recommendations for Other Services      Precautions / Restrictions Precautions Precautions: Fall Required Braces or Orthoses: Other Brace Other Brace: L hinged knee brace to prevent buckle Restrictions Weight Bearing Restrictions: No       Mobility Bed Mobility Overal bed mobility: Needs Assistance Bed Mobility: Supine to Sit     Supine to sit: Supervision        Transfers Overall transfer level: Needs assistance Equipment used: None Transfers: Sit to/from Stand Sit to Stand: Min guard;Supervision         General transfer comment: Cues for safety    Balance Overall balance assessment: Needs assistance Sitting-balance support: No upper extremity supported;Feet supported Sitting balance-Leahy Scale: Good     Standing balance support: During functional activity;No upper extremity supported Standing balance-Leahy Scale: Good Standing balance comment: pt able to ambulate without AD                           ADL either performed or assessed with clinical judgement   ADL Overall ADL's : Needs assistance/impaired     Grooming: Supervision/safety;Min guard;Standing;Wash/dry face;Oral care;Wash/dry Geophysical data processor Transfer: Min guard;Ambulation Toilet Transfer Details (indicate cue type and reason): Declined use of brace or RW due to urgency (see note for full comment)         Functional mobility during ADLs: Min guard;Minimal assistance;Rolling walker;Cueing for safety General ADL Comments: Pt now equipped with hinge brace, however continues to demonstrate decreased sensation and proprioception in  LLE as well as increased impulsivity when wanting to transfer to bathroom     Vision Baseline Vision/History: Wears glasses     Perception     Praxis      Cognition Arousal/Alertness: Awake/alert Behavior During Therapy: Impulsive Overall Cognitive Status: Within Functional Limits for tasks assessed                                  General Comments: Noted to be increasingly impulsive and unsafe at the begining of the session (see overall note for full comment)        Exercises     Shoulder Instructions       General Comments      Pertinent Vitals/ Pain       Pain Assessment: No/denies pain  Home Living                                          Prior Functioning/Environment              Frequency  Min 3X/week        Progress Toward Goals  OT Goals(current goals can now be found in the care plan section)  Progress towards OT goals: Progressing toward goals  Acute Rehab OT Goals Patient Stated Goal: to be able to go home after surgery on 3/24 safely OT Goal Formulation: With patient Time For Goal Achievement: 06/15/19 Potential to Achieve Goals: Good  Plan Discharge plan remains appropriate    Co-evaluation                 AM-PAC OT "6 Clicks" Daily Activity     Outcome Measure   Help from another person eating meals?: None Help from another person taking care of personal grooming?: None Help from another person toileting, which includes using toliet, bedpan, or urinal?: A Little Help from another person bathing (including washing, rinsing, drying)?: A Little Help from another person to put on and taking off regular upper body clothing?: A Little Help from another person to put on and taking off regular lower body clothing?: A Little 6 Click Score: 20    End of Session    OT Visit Diagnosis: Unsteadiness on feet (R26.81)   Activity Tolerance Patient tolerated treatment well   Patient Left in chair;with call bell/phone within reach;with chair alarm set   Nurse Communication Mobility status;Precautions        Time: 0539-7673 OT Time Calculation (min): 12 min  Charges: OT General Charges $OT Visit: 1 Visit OT Treatments $Self Care/Home Management : 8-22 mins  Corinne Ports E. Adreena Willits, COTA/L Acute  Rehabilitation Services [place] 06/07/2019, 2:35 PM

## 2019-06-08 ENCOUNTER — Inpatient Hospital Stay (HOSPITAL_COMMUNITY): Payer: Medicare Other | Admitting: Anesthesiology

## 2019-06-08 ENCOUNTER — Encounter (HOSPITAL_COMMUNITY): Payer: Self-pay | Admitting: Internal Medicine

## 2019-06-08 ENCOUNTER — Encounter (HOSPITAL_COMMUNITY)
Admission: EM | Disposition: A | Discharge status: Skilled Nursing Facility | Payer: Self-pay | Source: Home / Self Care | Attending: Internal Medicine

## 2019-06-08 DIAGNOSIS — E1110 Type 2 diabetes mellitus with ketoacidosis without coma: Secondary | ICD-10-CM

## 2019-06-08 HISTORY — PX: ENDARTERECTOMY: SHX5162

## 2019-06-08 LAB — CBC
HCT: 35 % — ABNORMAL LOW (ref 36.0–46.0)
Hemoglobin: 11 g/dL — ABNORMAL LOW (ref 12.0–15.0)
MCH: 27.4 pg (ref 26.0–34.0)
MCHC: 31.4 g/dL (ref 30.0–36.0)
MCV: 87.3 fL (ref 80.0–100.0)
Platelets: 336 10*3/uL (ref 150–400)
RBC: 4.01 MIL/uL (ref 3.87–5.11)
RDW: 14.4 % (ref 11.5–15.5)
WBC: 12.8 10*3/uL — ABNORMAL HIGH (ref 4.0–10.5)
nRBC: 0 % (ref 0.0–0.2)

## 2019-06-08 LAB — TYPE AND SCREEN
ABO/RH(D): B POS
Antibody Screen: NEGATIVE

## 2019-06-08 LAB — PROTIME-INR
INR: 0.8 (ref 0.8–1.2)
Prothrombin Time: 11.3 seconds — ABNORMAL LOW (ref 11.4–15.2)

## 2019-06-08 LAB — BASIC METABOLIC PANEL
Anion gap: 9 (ref 5–15)
BUN: 41 mg/dL — ABNORMAL HIGH (ref 6–20)
CO2: 21 mmol/L — ABNORMAL LOW (ref 22–32)
Calcium: 9.3 mg/dL (ref 8.9–10.3)
Chloride: 108 mmol/L (ref 98–111)
Creatinine, Ser: 2.26 mg/dL — ABNORMAL HIGH (ref 0.44–1.00)
GFR calc Af Amer: 27 mL/min — ABNORMAL LOW (ref >60)
GFR calc non Af Amer: 23 mL/min — ABNORMAL LOW (ref >60)
Glucose, Bld: 144 mg/dL — ABNORMAL HIGH (ref 70–99)
Potassium: 4.4 mmol/L (ref 3.5–5.1)
Sodium: 138 mmol/L (ref 135–145)

## 2019-06-08 LAB — GLUCOSE, CAPILLARY
Glucose-Capillary: 145 mg/dL — ABNORMAL HIGH (ref 70–99)
Glucose-Capillary: 98 mg/dL (ref 70–99)
Glucose-Capillary: 100 mg/dL — ABNORMAL HIGH (ref 70–99)
Glucose-Capillary: 145 mg/dL — ABNORMAL HIGH (ref 70–99)
Glucose-Capillary: 163 mg/dL — ABNORMAL HIGH (ref 70–99)
Glucose-Capillary: 174 mg/dL — ABNORMAL HIGH (ref 70–99)
Glucose-Capillary: 202 mg/dL — ABNORMAL HIGH (ref 70–99)

## 2019-06-08 LAB — ABO/RH: ABO/RH(D): B POS

## 2019-06-08 SURGERY — ENDARTERECTOMY, CAROTID
Anesthesia: General | Laterality: Right

## 2019-06-08 MED ORDER — FENTANYL CITRATE (PF) 100 MCG/2ML IJ SOLN
INTRAMUSCULAR | Status: AC
  Filled 2019-06-08: qty 2

## 2019-06-08 MED ORDER — HEPARIN SODIUM (PORCINE) 1000 UNIT/ML IJ SOLN
INTRAMUSCULAR | Status: DC | PRN
  Administered 2019-06-08: 8000 [IU] via INTRAVENOUS

## 2019-06-08 MED ORDER — DOCUSATE SODIUM 100 MG PO CAPS
100.0000 mg | ORAL_CAPSULE | Freq: Every day | ORAL | Status: DC
  Administered 2019-06-09: 100 mg via ORAL
  Filled 2019-06-08: qty 1

## 2019-06-08 MED ORDER — PROPOFOL 10 MG/ML IV BOLUS
INTRAVENOUS | Status: DC | PRN
  Administered 2019-06-08: 200 mg via INTRAVENOUS

## 2019-06-08 MED ORDER — ASPIRIN EC 81 MG PO TBEC
81.0000 mg | DELAYED_RELEASE_TABLET | Freq: Every day | ORAL | Status: DC
  Administered 2019-06-09: 81 mg via ORAL
  Filled 2019-06-08: qty 1

## 2019-06-08 MED ORDER — GUAIFENESIN-DM 100-10 MG/5ML PO SYRP: 15.0000 mL | ORAL_SOLUTION | ORAL | Status: DC | PRN

## 2019-06-08 MED ORDER — POTASSIUM CHLORIDE CRYS ER 20 MEQ PO TBCR: 20.0000 meq | EXTENDED_RELEASE_TABLET | Freq: Every day | ORAL | Status: DC | PRN

## 2019-06-08 MED ORDER — SODIUM CHLORIDE 0.9 % IV SOLN
INTRAVENOUS | Status: DC | PRN
  Administered 2019-06-08: .05 ug/kg/min via INTRAVENOUS
  Administered 2019-06-08: .5 ug/kg/min via INTRAVENOUS

## 2019-06-08 MED ORDER — ONDANSETRON HCL 4 MG/2ML IJ SOLN
INTRAMUSCULAR | Status: DC | PRN
  Administered 2019-06-08: 4 mg via INTRAVENOUS

## 2019-06-08 MED ORDER — 0.9 % SODIUM CHLORIDE (POUR BTL) OPTIME
TOPICAL | Status: DC | PRN
  Administered 2019-06-08: 09:00:00 2000 mL

## 2019-06-08 MED ORDER — ACETAMINOPHEN 325 MG PO TABS
325.0000 mg | ORAL_TABLET | ORAL | Status: DC | PRN
  Administered 2019-06-09 (×2): 650 mg via ORAL
  Filled 2019-06-08 (×2): qty 2

## 2019-06-08 MED ORDER — SODIUM CHLORIDE 0.9 % IV SOLN: 500.0000 mL | Freq: Once | INTRAVENOUS | Status: DC | PRN

## 2019-06-08 MED ORDER — LIDOCAINE HCL (CARDIAC) PF 100 MG/5ML IV SOSY
PREFILLED_SYRINGE | INTRAVENOUS | Status: DC | PRN
  Administered 2019-06-08: 60 mg via INTRAVENOUS

## 2019-06-08 MED ORDER — SUGAMMADEX SODIUM 200 MG/2ML IV SOLN
INTRAVENOUS | Status: DC | PRN
  Administered 2019-06-08: 200 mg via INTRAVENOUS

## 2019-06-08 MED ORDER — METOPROLOL TARTRATE 5 MG/5ML IV SOLN: 2.0000 mg | INTRAVENOUS | Status: DC | PRN

## 2019-06-08 MED ORDER — PROPOFOL 10 MG/ML IV BOLUS
INTRAVENOUS | Status: AC
  Filled 2019-06-08: qty 20

## 2019-06-08 MED ORDER — PHENYLEPHRINE HCL-NACL 10-0.9 MG/250ML-% IV SOLN
INTRAVENOUS | Status: DC | PRN
  Administered 2019-06-08: 75 ug/min via INTRAVENOUS
  Administered 2019-06-08: 50 ug/min via INTRAVENOUS

## 2019-06-08 MED ORDER — ONDANSETRON HCL 4 MG/2ML IJ SOLN: 4.0000 mg | Freq: Four times a day (QID) | INTRAMUSCULAR | Status: DC | PRN

## 2019-06-08 MED ORDER — FENTANYL CITRATE (PF) 100 MCG/2ML IJ SOLN
25.0000 ug | INTRAMUSCULAR | Status: DC | PRN
  Administered 2019-06-08: 50 ug via INTRAVENOUS
  Administered 2019-06-08 (×3): 25 ug via INTRAVENOUS

## 2019-06-08 MED ORDER — OXYCODONE HCL 5 MG PO TABS
ORAL_TABLET | ORAL | Status: AC
  Filled 2019-06-08: qty 1

## 2019-06-08 MED ORDER — HYDRALAZINE HCL 20 MG/ML IJ SOLN: 5.0000 mg | INTRAMUSCULAR | Status: DC | PRN

## 2019-06-08 MED ORDER — LABETALOL HCL 5 MG/ML IV SOLN
INTRAVENOUS | Status: DC | PRN
  Administered 2019-06-08: 10 mg via INTRAVENOUS

## 2019-06-08 MED ORDER — SODIUM CHLORIDE 0.9 % IV SOLN
INTRAVENOUS | Status: DC | PRN
  Administered 2019-06-08: 500 mL

## 2019-06-08 MED ORDER — PANTOPRAZOLE SODIUM 40 MG PO TBEC
40.0000 mg | DELAYED_RELEASE_TABLET | Freq: Every day | ORAL | Status: DC
  Administered 2019-06-09: 40 mg via ORAL
  Filled 2019-06-08: qty 1

## 2019-06-08 MED ORDER — PROTAMINE SULFATE 10 MG/ML IV SOLN
INTRAVENOUS | Status: DC | PRN
  Administered 2019-06-08 (×5): 10 mg via INTRAVENOUS

## 2019-06-08 MED ORDER — REMIFENTANIL HCL 1 MG IV SOLR
INTRAVENOUS | Status: DC | PRN
  Administered 2019-06-08: 3.97 ug via INTRAVENOUS

## 2019-06-08 MED ORDER — SODIUM CHLORIDE 0.9 % IV SOLN: INTRAVENOUS | Status: DC

## 2019-06-08 MED ORDER — SODIUM CHLORIDE 0.9 % IV SOLN
0.0125 ug/kg/min | INTRAVENOUS | Status: AC
  Filled 2019-06-08: qty 2000

## 2019-06-08 MED ORDER — OXYCODONE HCL 5 MG/5ML PO SOLN: 5.0000 mg | Freq: Once | ORAL | Status: AC | PRN

## 2019-06-08 MED ORDER — TRAMADOL HCL 50 MG PO TABS
50.0000 mg | ORAL_TABLET | Freq: Four times a day (QID) | ORAL | Status: DC | PRN
  Administered 2019-06-08 – 2019-06-09 (×5): 50 mg via ORAL
  Filled 2019-06-08 (×6): qty 1

## 2019-06-08 MED ORDER — OXYCODONE HCL 5 MG PO TABS
5.0000 mg | ORAL_TABLET | Freq: Once | ORAL | Status: AC | PRN
  Administered 2019-06-08: 5 mg via ORAL

## 2019-06-08 MED ORDER — PHENOL 1.4 % MT LIQD: 1.0000 | OROMUCOSAL | Status: DC | PRN

## 2019-06-08 MED ORDER — ROCURONIUM BROMIDE 50 MG/5ML IV SOSY
PREFILLED_SYRINGE | INTRAVENOUS | Status: DC | PRN
  Administered 2019-06-08: 50 mg via INTRAVENOUS

## 2019-06-08 MED ORDER — ACETAMINOPHEN 325 MG RE SUPP: 325.0000 mg | RECTAL | Status: DC | PRN

## 2019-06-08 MED ORDER — MAGNESIUM SULFATE 2 GM/50ML IV SOLN: 2.0000 g | Freq: Every day | INTRAVENOUS | Status: DC | PRN

## 2019-06-08 MED ORDER — LIDOCAINE HCL (PF) 1 % IJ SOLN
INTRAMUSCULAR | Status: AC
  Filled 2019-06-08: qty 30

## 2019-06-08 MED ORDER — CEFAZOLIN SODIUM-DEXTROSE 2-4 GM/100ML-% IV SOLN
2.0000 g | Freq: Three times a day (TID) | INTRAVENOUS | Status: AC
  Administered 2019-06-08 – 2019-06-09 (×2): 2 g via INTRAVENOUS
  Filled 2019-06-08 (×3): qty 100

## 2019-06-08 MED ORDER — SODIUM CHLORIDE 0.9 % IV SOLN
INTRAVENOUS | Status: AC
  Filled 2019-06-08: qty 1.2

## 2019-06-08 MED ORDER — FENTANYL CITRATE (PF) 100 MCG/2ML IJ SOLN
INTRAMUSCULAR | Status: AC
  Administered 2019-06-08: 25 ug via INTRAVENOUS
  Filled 2019-06-08: qty 2

## 2019-06-08 SURGICAL SUPPLY — 41 items
CANISTER SUCT 3000ML PPV (MISCELLANEOUS) ×2 IMPLANT
CANNULA VESSEL 3MM 2 BLNT TIP (CANNULA) ×4 IMPLANT
CATH ROBINSON RED A/P 18FR (CATHETERS) ×2 IMPLANT
CLIP LIGATING EXTRA MED SLVR (CLIP) ×2 IMPLANT
CLIP LIGATING EXTRA SM BLUE (MISCELLANEOUS) ×2 IMPLANT
COVER WAND RF STERILE (DRAPES) ×1 IMPLANT
DECANTER SPIKE VIAL GLASS SM (MISCELLANEOUS) IMPLANT
DERMABOND ADVANCED (GAUZE/BANDAGES/DRESSINGS) ×1
DERMABOND ADVANCED .7 DNX12 (GAUZE/BANDAGES/DRESSINGS) ×1 IMPLANT
DRAIN HEMOVAC 1/8 X 5 (WOUND CARE) IMPLANT
ELECT REM PT RETURN 9FT ADLT (ELECTROSURGICAL) ×2
ELECTRODE REM PT RTRN 9FT ADLT (ELECTROSURGICAL) ×1 IMPLANT
EVACUATOR SILICONE 100CC (DRAIN) IMPLANT
GLOVE SS BIOGEL STRL SZ 6.5 (GLOVE) IMPLANT
GLOVE SS BIOGEL STRL SZ 7.5 (GLOVE) ×1 IMPLANT
GLOVE SUPERSENSE BIOGEL SZ 6.5 (GLOVE) ×1
GLOVE SUPERSENSE BIOGEL SZ 7.5 (GLOVE) ×1
GOWN STRL REUS W/ TWL LRG LVL3 (GOWN DISPOSABLE) ×3 IMPLANT
GOWN STRL REUS W/TWL LRG LVL3 (GOWN DISPOSABLE) ×3
KIT BASIN OR (CUSTOM PROCEDURE TRAY) ×2 IMPLANT
KIT SHUNT ARGYLE CAROTID ART 6 (VASCULAR PRODUCTS) IMPLANT
KIT TURNOVER KIT B (KITS) ×2 IMPLANT
NEEDLE 22X1 1/2 (OR ONLY) (NEEDLE) IMPLANT
NS IRRIG 1000ML POUR BTL (IV SOLUTION) ×4 IMPLANT
PACK CAROTID (CUSTOM PROCEDURE TRAY) ×2 IMPLANT
PAD ARMBOARD 7.5X6 YLW CONV (MISCELLANEOUS) ×4 IMPLANT
PATCH HEMASHIELD 8X75 (Vascular Products) ×1 IMPLANT
POSITIONER HEAD DONUT 9IN (MISCELLANEOUS) ×2 IMPLANT
SET WALTER ACTIVATION W/DRAPE (SET/KITS/TRAYS/PACK) ×1 IMPLANT
SHUNT CAROTID BYPASS 10 (VASCULAR PRODUCTS) IMPLANT
SHUNT CAROTID BYPASS 12FRX15.5 (VASCULAR PRODUCTS) IMPLANT
SUT ETHILON 3 0 PS 1 (SUTURE) IMPLANT
SUT PROLENE 6 0 CC (SUTURE) ×3 IMPLANT
SUT SILK 3 0 (SUTURE)
SUT SILK 3-0 18XBRD TIE 12 (SUTURE) IMPLANT
SUT VIC AB 3-0 SH 27 (SUTURE) ×2
SUT VIC AB 3-0 SH 27X BRD (SUTURE) ×2 IMPLANT
SUT VICRYL 4-0 PS2 18IN ABS (SUTURE) ×2 IMPLANT
SYR CONTROL 10ML LL (SYRINGE) IMPLANT
TOWEL GREEN STERILE (TOWEL DISPOSABLE) ×2 IMPLANT
WATER STERILE IRR 1000ML POUR (IV SOLUTION) ×2 IMPLANT

## 2019-06-08 NOTE — Progress Notes (Signed)
PROGRESS NOTE    Lauren Wang  PQD:[id number] DOB: Dec 12, 1960 DOA: 05/31/2019 PCP: Nolene Ebbs, MD    Brief Narrative: 59 year old lady with prior history of insulin-dependent diabetes mellitus, essential hypertension, stroke about 5 years ago with residual weakness, stage III CKD, COPD presents with tingling and weakness of the left leg for 2 to 3 days along with slurred speech.  On arrival to ED she was found to be in HHS.    Repeat Doppler was performed and showed high-grade stenosis of the right ICA and it was followed by CTA of the head and neck showing Mild calcified plaque at the common carotid bifurcations and internal carotid origins.   Assessment & Plan:   Active Problems:   Hyperosmolar hyperglycemic state (HHS) ([place])   CVA (cerebral vascular accident) ([place])     HHS  Hemoglobin A1c is 15.1 She was initially started on insulin drip with normalization of CBGs. Currently on 50 units of Lantus, to be resumed tomorrow Continue with sliding scale insulin. CBG (last 3)  Recent Labs    06/08/19 0742 06/08/19 1139 06/08/19 1607  GLUCAP 100* 145* 163*     Essential hypertension Resume home medications.   Right carotid stenosis Vascular surgery consulted and she underwent right carotid endarterectomy with Dacron patch angioplasty by Dr. Donnetta Hutching today.  Hypokalemia Replaced.   Mild acute on stage III CKD Creatinine back to baseline.    Mild normocytic anemia hemoglobin around 11.  Continue to monitor   COPD No wheezing heard     DVT prophylaxis: Heparin  code Status: Full code Family Communication: None at bedside Disposition Plan:  . Patient came from: Home            . Anticipated d/c place: SNF Barriers to d/c OR conditions which need to be met to effect a safe d/c: Pending endarterectomy  Consultants:   Neurology   Vascular surgery.   Procedures:  Carotid endarterectomy   Antimicrobials:  None.   Subjective:  no chest pain or  sob,no nausea, vomiting or abdominal pain.   Objective: Vitals:   06/07/19 2353 06/08/19 0410 06/08/19 0758 06/08/19 0835  BP: 126/79 (!) 108/57 (!) 141/76   Pulse: 73 68 65   Resp: _0 Temp: 97.6 F (36.4 C) 97.8 F (36.6 C) 98 F (36.7 C)   TempSrc:  Oral Oral   SpO2: 97% 97% 100%   Weight:    79.4 kg  Height:    _1  (1.676 m)    Intake/Output Summary (Last 24 hours) at 06/08/2019 0949 Last data filed at 06/07/2019 2300 Gross per 24 hour  Intake 360 ml  Output --  Net 360 ml   Filed Weights   05/31/19 1523 06/08/19 0835  Weight: 79.4 kg 79.4 kg    Examination:  General exam: Appears calm and comfortable  Respiratory system: Clear to auscultation. Respiratory effort normal. Cardiovascular system: S1 & S2 heard, RRR, no pedal edema, no JVD Gastrointestinal system: Abdomen is nondistended, soft and nontender.. Normal bowel sounds heard. Central nervous system: Alert and oriented,  Extremities: Symmetric 5 x 5 power.  Tingling and numbness of the LEFT lower extremity present, able to move all extremities Skin: No rashes, lesions or ulcers Psychiatry: . Mood & affect appropriate.     Data Reviewed: I have personally reviewed following labs and imaging studies  CBC: Recent Labs  Lab 06/02/19 0356 06/08/19 0432  WBC 8.9 12.8*  NEUTROABS 3.3  --   HGB 11.3* 11.0*  HCT 34.8* 35.0*  MCV 83.5 87.3  PLT 371 536   Basic Metabolic Panel: Recent Labs  Lab 06/02/19 0356 06/03/19 0328 06/04/19 0445 06/05/19 0528 06/08/19 0432  NA 139 138 135 135 138  K 3.7 3.9 4.2 4.0 4.4  CL 111 107 108 108 108  CO2 17* 18* 17* 17* 21*  GLUCOSE 102* 242* 318* 273* 144*  BUN 42* 39* 42* 42* 41*  CREATININE 2.84* 2.36* 2.45* 2.29* 2.26*  CALCIUM 9.1 9.0 8.7* 8.8* 9.3  MG  --  1.9  --   --   --   PHOS  --  3.0  --   --   --    GFR: Estimated Creatinine Clearance: 29.5 mL/min (A) (by C-G formula based on SCr of 2.26 mg/dL (H)). Liver Function Tests: Recent Labs   Lab 06/02/19 0356  AST 17  ALT 14  ALKPHOS 123  BILITOT 0.5  PROT 6.3*  ALBUMIN 2.8*   No results for input(s): LIPASE, AMYLASE in the last 168 hours. No results for input(s): AMMONIA in the last 168 hours. Coagulation Profile: Recent Labs  Lab 06/08/19 0432  INR 0.8   Cardiac Enzymes: No results for input(s): CKTOTAL, CKMB, CKMBINDEX, TROPONINI in the last 168 hours. BNP (last 3 results) No results for input(s): PROBNP in the last 8760 hours. HbA1C: No results for input(s): HGBA1C in the last 72 hours. CBG: Recent Labs  Lab 06/07/19 2000 06/07/19 2355 06/08/19 0411 06/08/19 0620 06/08/19 0742  GLUCAP 264* 316* 145* 98 100*   Lipid Profile: No results for input(s): CHOL, HDL, LDLCALC, TRIG, CHOLHDL, LDLDIRECT in the last 72 hours. Thyroid Function Tests: No results for input(s): TSH, T4TOTAL, FREET4, T3FREE, THYROIDAB in the last 72 hours. Anemia Panel: No results for input(s): VITAMINB12, FOLATE, FERRITIN, TIBC, IRON, RETICCTPCT in the last 72 hours. Sepsis Labs: No results for input(s): PROCALCITON, LATICACIDVEN in the last 168 hours.  Recent Results (from the past 240 hour(s))  SARS CORONAVIRUS 2 (TAT 6-24 HRS) Nasopharyngeal Nasopharyngeal Swab     Status: None   Collection Time: 06/03/19  3:07 PM   Specimen: Nasopharyngeal Swab  Result Value Ref Range Status   SARS Coronavirus 2 NEGATIVE NEGATIVE Final    Comment: (NOTE) SARS-CoV-2 target nucleic acids are NOT DETECTED. The SARS-CoV-2 RNA is generally detectable in upper and lower respiratory specimens during the acute phase of infection. Negative results do not preclude SARS-CoV-2 infection, do not rule out co-infections with other pathogens, and should not be used as the sole basis for treatment or other patient management decisions. Negative results must be combined with clinical observations, patient history, and epidemiological information. The expected result is Negative. Fact Sheet for  Patients: [employer]be Fact Sheet for Healthcare Providers: https://www.woods-mathews.com/ This test is not yet approved or cleared by the [place] FDA and  has been authorized for detection and/or diagnosis of SARS-CoV-2 by FDA under an Emergency Use Authorization (EUA). This EUA will remain  in effect (meaning this test can be used) for the duration of the COVID-19 declaration under Section 56 4(b)(1) of the Act, 21 U.S.C. section 360bbb-3(b)(1), unless the authorization is terminated or revoked sooner. Performed at [facility], [place] [address], [place], [place] [address]   Surgical pcr screen     Status: None   Collection Time: 06/07/19  7:51 PM   Specimen: Nasal Mucosa; Nasal Swab  Result Value Ref Range Status   MRSA, PCR NEGATIVE NEGATIVE Final   Staphylococcus aureus NEGATIVE NEGATIVE Final  Comment: (NOTE) The Xpert SA Assay (FDA approved for NASAL specimens in patients 60 years of age and older), is one component of a comprehensive surveillance program. It is not intended to diagnose infection nor to guide or monitor treatment. Performed at [facility], [place] [address], [place], [place] [address]          Radiology Studies: No results found.      Scheduled Meds: . [MAR Hold] Buprenorphine  1 patch Transdermal Q Fri  . [MAR Hold] heparin  5,000 Units Subcutaneous Q8H  . [MAR Hold] insulin aspart  0-20 Units Subcutaneous Q4H  . [MAR Hold] multivitamin with minerals  1 tablet Oral Daily  . [MAR Hold] nystatin  5 mL Oral Q6H  . remifentanil (ULTIVA) 2 mg in 100 mL normal saline (20 mcg/mL) Optime  0.0125 mcg/kg/min Intravenous To OR  . [MAR Hold] rosuvastatin  40 mg Oral Daily  . [MAR Hold] sodium bicarbonate  650 mg Oral TID   Continuous Infusions: . sodium chloride Stopped (05/31/19 2335)  . sodium chloride 10 mL/hr at 06/08/19 0912  . [MAR Hold] cefUROXime (ZINACEF)  IV       LOS: 8 days         Hosie Poisson, MD [facility]   To contact the attending provider between 7A-7P or the covering provider during after hours 7P-7A, please log into the web site [email or web address] and access using universal [employer] password for that web site. If you do not have the password, please call the hospital operator.  06/08/2019, 9:49 AM

## 2019-06-08 NOTE — Op Note (Signed)
   OPERATIVE REPORT  DATE OF SURGERY: 06/08/2019  PATIENT: Lauren Wang, 59 y.o. female MRN: [id number]  DOB: 1961-03-07  PRE-OPERATIVE DIAGNOSIS: Right Carotid Stenosis, Symptomatic  POST-OPERATIVE DIAGNOSIS:  Same  PROCEDURE:  Right Carotid Endarterectomy with Dacron Patch Angioplasty  SURGEON:  Curt Jews, M.D.  PHYSICIAN ASSISTANT: Risa Grill, PA-C  ANESTHESIA:   general  EBL: Less than 200 ml  Total I/O In: 500 [I.V.:500] Out: -   BLOOD ADMINISTERED: none  DRAINS: none   SPECIMEN: none  COUNTS CORRECT:  YES  PLAN OF CARE: Admit to inpatient   PATIENT DISPOSITION:  PACU - hemodynamically stable and neurologically intact.  PROCEDURE DETAILS: The patient was taken to the operating room placed in supine position.  General anesthesia was administered.  The neck was prepped and draped in the usual sterile fashion.  An incision was made anterior to the sternocleidomastoid and carried down through the platysma with electrocautery.  The sternocleidomastoid was reflected posteriorly and the carotid sheath was opened.  The facial vein was ligated with 2-0 silk ties and divided.  The common carotid artery was encircled with an umbilical tape and Rummel tourniquet.  The vagus nerve was identified and preserved.  Dissection was continued onto the carotid bifurcation.  The superior thyroid artery was encircled with a 2-0 silk Potts tie.  The external carotid was encircled with a blue vessel loop and the internal carotid was encircled with an umbilical tape and Rummel tourniquet.  The hypoglossal nerve was identified and preserved.  The patient was given systemic heparin and after adequate circulation time, the internal, external and common carotid arteries were occluded with vascular clamps.  The common carotid artery was opened with an 11 blade and extended  longitudinally with Potts scissors.  The attention was attempted to pass proximally.  There was some resistance at the skull  base and therefore the shunt was not placed.  There was excellent pulsatile backbleeding.  T.  The endarterectomy was begun on the common carotid artery and the plaque was divided proximally with Potts scissors.  The endarterectomy was continued onto the bifurcation.  The external carotid was endarterectomized with an eversion technique and the internal carotid was endarterectomized in an open fashion.  Remaining atheromatous debris was removed from the endarterectomy plane.  A Finesse Hemashield Dacron patch was brought onto the field and was sewn as a patch angioplasty with a running 6-0 Prolene suture.  Prior to completion of the closure the  usual flushing maneuvers were undertaken.  The anastomosis was completed and flow was restored first to the external and then the internal carotid artery.  Excellent flow characteristics were noted with hand-held Doppler in the internal and external carotid arteries.  The patient was given 50 mg of protamine to reverse the heparin.  The wounds were irrigated with saline.  Hemostasis was obtained with electrocautery.  The wounds were closed with 3-0 Vicryl to reapproximate the sternocleidomastoid over the carotid sheath.  The platysma was lysed with a running 3-0 Vicryl suture.  The skin was closed with a 4-0 subcuticular Vicryl stitch.  Dermabond was applied.  The patient was awakened neurologically intact in the operating room and transferred to the recovery room in stable condition   Curt Jews, M.D. 06/08/2019 1:49 PM

## 2019-06-08 NOTE — Anesthesia Procedure Notes (Signed)
Procedure Name: Intubation Date/Time: 06/08/2019 9:43 AM Performed by: Eligha Bridegroom, CRNA Pre-anesthesia Checklist: Patient identified, Emergency Drugs available, Suction available, Patient being monitored and Timeout performed Patient Re-evaluated:Patient Re-evaluated prior to induction Oxygen Delivery Method: Circle system utilized Preoxygenation: Pre-oxygenation with 100% oxygen Induction Type: IV induction Ventilation: Mask ventilation without difficulty and Oral airway inserted - appropriate to patient size Laryngoscope Size: Mac Grade View: Grade II Tube type: Oral Tube size: 7.0 mm Number of attempts: 1 Airway Equipment and Method: Stylet Placement Confirmation: ETT inserted through vocal cords under direct vision,  positive ETCO2 and breath sounds checked- equal and bilateral Secured at: 22 cm Tube secured with: Tape Dental Injury: Teeth and Oropharynx as per pre-operative assessment

## 2019-06-08 NOTE — Discharge Instructions (Signed)
° °  [facility] ° °Discharge Instructions °  °Carotid Endarterectomy (CEA) ° °Please refer to the following instructions for your post-procedure care. Your surgeon or physician assistant will discuss any changes with you. ° °Activity ° °You are encouraged to walk as much as you can. You can slowly return to normal activities but must avoid strenuous activity and heavy lifting until your doctor tell you it's okay. Avoid activities such as vacuuming or swinging a golf club. You can drive after one week if you are comfortable and you are no longer taking prescription pain medications. It is normal to feel tired for serval weeks after your surgery. It is also normal to have difficulty with sleep habits, eating, and bowel movements after surgery. These will go away with time. ° °Bathing/Showering ° °Shower daily after you go home. Do not soak in a bathtub, hot tub, or swim until the incision heals completely. ° °Incision Care ° °Shower every day. Clean your incision with mild soap and water. Pat the area dry with a clean towel. You do not need a bandage unless otherwise instructed. Do not apply any ointments or creams to your incision. You may have skin glue on your incision. Do not peel it off. It will come off on its own in about one week. Your incision may feel thickened and raised for several weeks after your surgery. This is normal and the skin will soften over time.  ° °For Men Only: It's okay to shave around the incision but do not shave the incision itself for 2 weeks. It is common to have numbness under your chin that could last for several months. ° °Diet ° °Resume your normal diet. There are no special food restrictions following this procedure. A low fat/low cholesterol diet is recommended for all patients with vascular disease. In order to heal from your surgery, it is CRITICAL to get adequate nutrition. Your body requires vitamins, minerals, and protein. Vegetables are the  best source of vitamins and minerals. Vegetables also provide the perfect balance of protein. Processed food has little nutritional value, so try to avoid this. ° °Medications ° °Resume taking all of your medications unless your doctor or physician assistant tells you not to. If your incision is causing pain, you may take over-the- counter pain relievers such as acetaminophen (Tylenol). If you were prescribed a stronger pain medication, please be aware these medications can cause nausea and constipation. Prevent nausea by taking the medication with a snack or meal. Avoid constipation by drinking plenty of fluids and eating foods with a high amount of fiber, such as fruits, vegetables, and grains.  °Do not take Tylenol if you are taking prescription pain medications. ° °Follow Up ° °Our office will schedule a follow up appointment 2-3 weeks following discharge. ° °Please call us immediately for any of the following conditions ° °Increased pain, redness, drainage (pus) from your incision site. °Fever of 101 degrees or higher. °If you should develop stroke (slurred speech, difficulty swallowing, weakness on one side of your body, loss of vision) you should call 911 and go to the nearest emergency room. ° °Reduce your risk of vascular disease: ° °Stop smoking. If you would like help call [facility] at 1-800-QUIT-NOW (1-800-784-8669) or  at 336-586-4000. °Manage your cholesterol °Maintain a desired weight °Control your diabetes °Keep your blood pressure down ° °If you have any questions, please call the office at [phone]. ° °

## 2019-06-08 NOTE — TOC Progression Note (Signed)
Transition of Care [facility]) - Progression Note    Patient Details  Name: Lauren Wang MRN: [id number] Date of Birth: 1960-11-12  Transition of [facility]) CM/SW [place], [place] Phone Number: 06/08/2019, 4:53 PM  Clinical Narrative:     Patient has confirmed SNF placement with [facility]. Patient will need covid test & results back prior to discharge to SNF.  [name], MSW, [place] Clinical Social Worker   Expected Discharge Plan: Skilled Nursing Facility Barriers to Discharge: Continued Medical Work up  Expected Discharge Plan and Services Expected Discharge Plan: [place] arrangements for the past 2 months: Single Family Home Expected Discharge Date: 06/06/19                                     Social Determinants of Health (SDOH) Interventions    Readmission Risk Interventions No flowsheet data found.

## 2019-06-08 NOTE — Interval H&P Note (Signed)
History and Physical Interval Note:  06/08/2019 8:59 AM  Lauren Wang  has presented today for surgery, with the diagnosis of RIGHT CAROTID STENOSIS.  The various methods of treatment have been discussed with the patient and family. After consideration of risks, benefits and other options for treatment, the patient has consented to  Procedure(s): ENDARTERECTOMY CAROTID (Right) as a surgical intervention.  The patient's history has been reviewed, patient examined, no change in status, stable for surgery.  I have reviewed the patient's chart and labs.  Questions were answered to the patient's satisfaction.     Curt Jews

## 2019-06-08 NOTE — Progress Notes (Signed)
Patient off floor for procedure 

## 2019-06-08 NOTE — Anesthesia Preprocedure Evaluation (Signed)
Anesthesia Evaluation  Patient identified by MRN, date of birth, ID band Patient awake    Reviewed: Allergy & Precautions, H&P , NPO status , Patient's Chart, lab work & pertinent test results  Airway Mallampati: II   Neck ROM: full    Dental   Pulmonary asthma , COPD, Current Smoker,    breath sounds clear to auscultation       Cardiovascular hypertension,  Rhythm:regular Rate:Normal     Neuro/Psych PSYCHIATRIC DISORDERS Anxiety Depression    GI/Hepatic   Endo/Other  diabetes, Type 2  Renal/GU Renal InsufficiencyRenal disease     Musculoskeletal   Abdominal   Peds  Hematology   Anesthesia Other Findings   Reproductive/Obstetrics                             Anesthesia Physical Anesthesia Plan  ASA: III  Anesthesia Plan: General   Post-op Pain Management:    Induction: Intravenous  PONV Risk Score and Plan: 2 and Ondansetron, Dexamethasone, Midazolam and Treatment may vary due to age or medical condition  Airway Management Planned: Oral ETT  Additional Equipment: Arterial line  Intra-op Plan:   Post-operative Plan: Extubation in OR  Informed Consent: I have reviewed the patients History and Physical, chart, labs and discussed the procedure including the risks, benefits and alternatives for the proposed anesthesia with the patient or authorized representative who has indicated his/her understanding and acceptance.       Plan Discussed with: CRNA, Anesthesiologist and Surgeon  Anesthesia Plan Comments:         Anesthesia Quick Evaluation

## 2019-06-08 NOTE — Progress Notes (Signed)
  Progress Note    06/08/2019 1:01 PM Day of Surgery  Subjective:  Seen in recovery room. States she is having incision pain and pain in her right ear and tooth   Vitals:   06/08/19 1239 06/08/19 1254  BP: 117/76 115/68  Pulse: (!) 58 (!) 59  Resp: 14 (!) 9  Temp:    SpO2: 98% 97%   Physical Exam: General: in no acute distress, some discomfort Lungs:  Non labored Incisions:  Right neck incision clean, dry and intact. No swelling or hematoma Extremities:  Extremities well perfused. Moving all extremities without weakness or numbness Neurologic: Alert and oriented. Neurologically intact. Speech is coherent. Face symmetrical without drooping. Tongue midline.  CBC    Component Value Date/Time   WBC 12.8 (H) 06/08/2019 0432   RBC 4.01 06/08/2019 0432   HGB 11.0 (L) 06/08/2019 0432   HCT 35.0 (L) 06/08/2019 0432   PLT 336 06/08/2019 0432   MCV 87.3 06/08/2019 0432   MCH 27.4 06/08/2019 0432   MCHC 31.4 06/08/2019 0432   RDW 14.4 06/08/2019 0432   LYMPHSABS 4.6 (H) 06/02/2019 0356   MONOABS 0.6 06/02/2019 0356   EOSABS 0.3 06/02/2019 0356   BASOSABS 0.1 06/02/2019 0356    BMET    Component Value Date/Time   NA 138 06/08/2019 0432   K 4.4 06/08/2019 0432   CL 108 06/08/2019 0432   CO2 21 (L) 06/08/2019 0432   GLUCOSE 144 (H) 06/08/2019 0432   BUN 41 (H) 06/08/2019 0432   CREATININE 2.26 (H) 06/08/2019 0432   CALCIUM 9.3 06/08/2019 0432   GFRNONAA 23 (L) 06/08/2019 0432   GFRAA 27 (L) 06/08/2019 0432    INR    Component Value Date/Time   INR 0.8 06/08/2019 0432     Intake/Output Summary (Last 24 hours) at 06/08/2019 1301 Last data filed at 06/08/2019 1134 Gross per 24 hour  Intake 860 ml  Output --  Net 860 ml     Assessment/Plan:  59 y.o. female is s/p right carotid endarterectomy Day of Surgery. Seen in recovery room doing well. Some neck soreness. Neurologically intact. Moving all extremities - To 4E later today when bed ready  DVT prophylaxis:  Sq  Heparin   Karoline Caldwell, PA-C [facility] [phone] 06/08/2019 1:01 PM

## 2019-06-08 NOTE — Transfer of Care (Signed)
Immediate Anesthesia Transfer of Care Note  Patient: Lauren Wang  Procedure(s) Performed: RIGHT ENDARTERECTOMY CAROTID (Right )  Patient Location: PACU  Anesthesia Type:General  Level of Consciousness: awake, alert  and oriented  Airway & Oxygen Therapy: Patient Spontanous Breathing  Post-op Assessment: Report given to RN and Post -op Vital signs reviewed and stable  Post vital signs: Reviewed and stable  Last Vitals:  Vitals Value Taken Time  BP 139/70 06/08/19 1139  Temp    Pulse 67 06/08/19 1143  Resp 11 06/08/19 1143  SpO2 98 % 06/08/19 1143  Vitals shown include unvalidated device data.  Last Pain:  Vitals:   06/08/19 0758  TempSrc: Oral  PainSc:       Patients Stated Pain Goal: 3 (02/40/97 3532)  Complications: No apparent anesthesia complications

## 2019-06-09 ENCOUNTER — Encounter: Payer: Self-pay | Admitting: *Deleted

## 2019-06-09 DIAGNOSIS — E1159 Type 2 diabetes mellitus with other circulatory complications: Secondary | ICD-10-CM

## 2019-06-09 LAB — GLUCOSE, CAPILLARY
Glucose-Capillary: 170 mg/dL — ABNORMAL HIGH (ref 70–99)
Glucose-Capillary: 227 mg/dL — ABNORMAL HIGH (ref 70–99)
Glucose-Capillary: 260 mg/dL — ABNORMAL HIGH (ref 70–99)
Glucose-Capillary: 296 mg/dL — ABNORMAL HIGH (ref 70–99)
Glucose-Capillary: 87 mg/dL (ref 70–99)

## 2019-06-09 LAB — CBC
HCT: 35.2 % — ABNORMAL LOW (ref 36.0–46.0)
Hemoglobin: 10.6 g/dL — ABNORMAL LOW (ref 12.0–15.0)
MCH: 27.5 pg (ref 26.0–34.0)
MCHC: 30.1 g/dL (ref 30.0–36.0)
MCV: 91.4 fL (ref 80.0–100.0)
Platelets: 297 10*3/uL (ref 150–400)
RBC: 3.85 MIL/uL — ABNORMAL LOW (ref 3.87–5.11)
RDW: 14.6 % (ref 11.5–15.5)
WBC: 16.3 10*3/uL — ABNORMAL HIGH (ref 4.0–10.5)
nRBC: 0 % (ref 0.0–0.2)

## 2019-06-09 LAB — BASIC METABOLIC PANEL
Anion gap: 12 (ref 5–15)
BUN: 32 mg/dL — ABNORMAL HIGH (ref 6–20)
CO2: 19 mmol/L — ABNORMAL LOW (ref 22–32)
Calcium: 9 mg/dL (ref 8.9–10.3)
Chloride: 109 mmol/L (ref 98–111)
Creatinine, Ser: 2.42 mg/dL — ABNORMAL HIGH (ref 0.44–1.00)
GFR calc Af Amer: 25 mL/min — ABNORMAL LOW (ref >60)
GFR calc non Af Amer: 22 mL/min — ABNORMAL LOW (ref >60)
Glucose, Bld: 160 mg/dL — ABNORMAL HIGH (ref 70–99)
Potassium: 4.9 mmol/L (ref 3.5–5.1)
Sodium: 140 mmol/L (ref 135–145)

## 2019-06-09 LAB — SARS CORONAVIRUS 2 (TAT 6-24 HRS): SARS Coronavirus 2: NEGATIVE

## 2019-06-09 MED ORDER — GUAIFENESIN-DM 100-10 MG/5ML PO SYRP: 15.0000 mL | ORAL_SOLUTION | ORAL | 0 refills | Status: AC | PRN

## 2019-06-09 MED ORDER — ASPIRIN 81 MG PO TBEC: 81.0000 mg | DELAYED_RELEASE_TABLET | Freq: Every day | ORAL | Status: AC

## 2019-06-09 MED ORDER — POTASSIUM CHLORIDE CRYS ER 20 MEQ PO TBCR: 20.0000 meq | EXTENDED_RELEASE_TABLET | Freq: Every day | ORAL | Status: AC | PRN

## 2019-06-09 MED ORDER — PANTOPRAZOLE SODIUM 40 MG PO TBEC: 40.0000 mg | DELAYED_RELEASE_TABLET | Freq: Every day | ORAL | 0 refills | Status: AC

## 2019-06-09 MED ORDER — LORAZEPAM 0.5 MG PO TABS: 0.5000 mg | ORAL_TABLET | Freq: Four times a day (QID) | ORAL | 0 refills | Status: AC | PRN

## 2019-06-09 MED ORDER — INSULIN DETEMIR 100 UNIT/ML ~~LOC~~ SOLN
30.0000 [IU] | Freq: Every day | SUBCUTANEOUS | Status: DC
  Administered 2019-06-09: 30 [IU] via SUBCUTANEOUS
  Filled 2019-06-09: qty 0.3

## 2019-06-09 MED ORDER — DOCUSATE SODIUM 100 MG PO CAPS: 100.0000 mg | ORAL_CAPSULE | Freq: Every day | ORAL | 0 refills | Status: AC

## 2019-06-09 NOTE — Plan of Care (Signed)

## 2019-06-09 NOTE — Progress Notes (Addendum)
  Progress Note    06/09/2019 7:00 AM 1 Day Post-Op  Subjective:  Doing well.  Neck pain better. Swallowing well.   Vitals:   06/09/19 0400 06/09/19 0600  BP: 136/69 129/75  Pulse:  68  Resp: 15 12  Temp:    SpO2:  95%    Physical Exam Cardiac:  RRR Lungs:  nonlabored Incisions:  Right neck without edema or bleeding Extremities:  Moving all extremities well. 5/5 grip strength.    CBC    Component Value Date/Time   WBC 16.3 (H) 06/09/2019 0449   RBC 3.85 (L) 06/09/2019 0449   HGB 10.6 (L) 06/09/2019 0449   HCT 35.2 (L) 06/09/2019 0449   PLT 297 06/09/2019 0449   MCV 91.4 06/09/2019 0449   MCH 27.5 06/09/2019 0449   MCHC 30.1 06/09/2019 0449   RDW 14.6 06/09/2019 0449   LYMPHSABS 4.6 (H) 06/02/2019 0356   MONOABS 0.6 06/02/2019 0356   EOSABS 0.3 06/02/2019 0356   BASOSABS 0.1 06/02/2019 0356    BMET    Component Value Date/Time   NA 140 06/09/2019 0449   K 4.9 06/09/2019 0449   CL 109 06/09/2019 0449   CO2 19 (L) 06/09/2019 0449   GLUCOSE 160 (H) 06/09/2019 0449   BUN 32 (H) 06/09/2019 0449   CREATININE 2.42 (H) 06/09/2019 0449   CALCIUM 9.0 06/09/2019 0449   GFRNONAA 22 (L) 06/09/2019 0449   GFRAA 25 (L) 06/09/2019 0449     Intake/Output Summary (Last 24 hours) at 06/09/2019 0700 Last data filed at 06/09/2019 0600 Gross per 24 hour  Intake 2105 ml  Output 1202 ml  Net 903 ml    HOSPITAL MEDICATIONS Scheduled Meds: . aspirin EC  81 mg Oral Q0600  . Buprenorphine  1 patch Transdermal Q Fri  . docusate sodium  100 mg Oral Daily  . heparin  5,000 Units Subcutaneous Q8H  . insulin aspart  0-20 Units Subcutaneous Q4H  . multivitamin with minerals  1 tablet Oral Daily  . nystatin  5 mL Oral Q6H  . pantoprazole  40 mg Oral Daily  . remifentanil (ULTIVA) 2 mg in 100 mL normal saline (20 mcg/mL) Optime  0.0125 mcg/kg/min Intravenous To OR  . rosuvastatin  40 mg Oral Daily  . sodium bicarbonate  650 mg Oral TID   Continuous Infusions: . sodium  chloride 0  (05/31/19 2335)  . sodium chloride 10 mL/hr at 06/08/19 0912  . sodium chloride    . sodium chloride 75 mL/hr at 06/09/19 0525  . magnesium sulfate bolus IVPB     PRN Meds:.sodium chloride, acetaminophen **OR** acetaminophen, acetaminophen, albuterol, alum & mag hydroxide-simeth, dextrose, diphenhydrAMINE, guaiFENesin-dextromethorphan, hydrALAZINE, labetalol, LORazepam, magnesium sulfate bolus IVPB, metoprolol tartrate, ondansetron, phenol, potassium chloride, traMADol, zolpidem  Assessment:  59 y.o. female is s/p: right CEA. Hemodynamically stable. Neurologically intact.  1 Day Post-Op  Plan: -Follow-up with Dr. Donnetta Hutching in 2-3  -DVT prophylaxis:  heparin   Risa Grill, PA-C [facility] [phone] 06/09/2019  7:00 AM   I have examined the patient, reviewed and agree with above.  Complains of right neck and ear soreness.  Neuro stable from preop.  Okay for discharge when stable from physical therapy standpoint  Curt Jews, MD 06/09/2019 10:49 AM

## 2019-06-09 NOTE — TOC Transition Note (Signed)
Transition of Care [facility]) - CM/SW Discharge Note   Patient Details  Name: Lauren Wang MRN: [id number] Date of Birth: [birth year]  Transition of Care [facility]) CM/SW Contact:  Vinie Sill, [place] Phone Number: 06/09/2019, 2:40 PM   Clinical Narrative:     Patient will DC to: [facility] DC Date: 06/09/19 Family Notified: [name] Transport GR:MBOB @ 3:30pm  RN, patient, and facility notified of DC. Discharge Summary sent to facility. RN given number for [phone]. Ambulance transport requested for patient.   Clinical Social Worker signing off.  [name], MSW, [place] Clinical Social Worker    Final next level of care: Skilled Nursing Facility Barriers to Discharge: Barriers Resolved   Patient Goals and CMS Choice Patient states their goals for this hospitalization and ongoing recovery are:: To go back home      Discharge Placement              Patient chooses bed at: ([facility]) Patient to be transferred to facility by: [place] Name of family member notified: [name],son Patient and family notified of of transfer: 06/09/19  Discharge Plan and Services                                     Social Determinants of Health (SDOH) Interventions     Readmission Risk Interventions No flowsheet data found.

## 2019-06-09 NOTE — Progress Notes (Signed)
Physical Therapy Treatment Patient Details Name: Lauren Wang MRN: [id number] DOB: 1961/02/15 Today's Date: 06/09/2019    History of Present Illness 59 y.o. female with medical history significant of COPD, IIDM off DM meds for 5 years by her PCP, hypertension, stroke about 5 years ago with L residue sided weakness, CKD stage III, chronic metabolic acidosis, presents tingling and weakness in her left leg for 2-3 days with slurred speech. She also feels generalized weakness malaise for last month.  Pt was admitted with hyperosmolar hyperglycemic state, found to have high grade stenosis R ICA with vascular consult.  MRI Brain: Moderate chronic small-vessel ischemic changes affecting thecerebral hemispheres as above. Small cortical infarctions at thefrontoparietal vertex regions, left more than right.  Pt is now s/p R carotid endartectomy on 06/08/19.    PT Comments    Pt seen POD #1 R carotid endartectomy.  No significant change in mobility status post-op/since last treatment.  Cont initial POC and goals.  Pt was able to ambulate in hall without AD for 300' with min cues for safety.  Pt required cues for exercise technique as well as increased time for L LE activation.  Encouraged to perform HEP (bed exercises, quad sets, ankle pumps/ABCs) on her own.  Continue to recommend 24 hr supervision for safety.   Follow Up Recommendations  SNF;Supervision/Assistance - 24 hour     Equipment Recommendations  None recommended by PT    Recommendations for Other Services       Precautions / Restrictions Precautions Precautions: Fall Required Braces or Orthoses: Other Brace Other Brace: L hinged knee brace to prevent buckle Restrictions Weight Bearing Restrictions: Yes LLE Weight Bearing: Weight bearing as tolerated    Mobility  Bed Mobility Overal bed mobility: Needs Assistance Bed Mobility: Supine to Sit     Supine to sit: Supervision Sit to supine: Supervision      Transfers Overall  transfer level: Needs assistance Equipment used: None Transfers: Sit to/from Stand Sit to Stand: Min guard         General transfer comment: cues for safe hand placement; practiced x 3 without hinged brace for stengthening  Ambulation/Gait Ambulation/Gait assistance: Min guard Gait Distance (Feet): 300 Feet Assistive device: None Gait Pattern/deviations: Decreased stride length;Decreased stance time - left     General Gait Details: ambulated with hinged knee brace unlocked ; cues for safety   Stairs             Wheelchair Mobility    Modified Rankin (Stroke Patients Only) Modified Rankin (Stroke Patients Only) Modified Rankin: Moderate disability     Balance Overall balance assessment: Needs assistance Sitting-balance support: No upper extremity supported;Feet supported Sitting balance-Leahy Scale: Good     Standing balance support: During functional activity;No upper extremity supported Standing balance-Leahy Scale: Good                              Cognition Arousal/Alertness: Awake/alert Behavior During Therapy: Flat affect Overall Cognitive Status: Within Functional Limits for tasks assessed                                        Exercises General Exercises - Lower Extremity Ankle Circles/Pumps: AROM;Both;10 reps Quad Sets: AROM;Both;10 reps;Supine Long Arc Quad: AROM;Both;10 reps Heel Slides: AROM;Both;10 reps;Supine Hip ABduction/ADduction: AROM;Both;10 reps;Supine Hip Flexion/Marching: AROM;Both;10 reps;Supine    General Comments General comments (  skin integrity, edema, etc.): Pt had c/o lightheadiness.  All VSS throughout therapy.  BP sit 139/72, stand 137/70, post walk 167/74      Pertinent Vitals/Pain Pain Assessment: No/denies pain    Home Living                      Prior Function            PT Goals (current goals can now be found in the care plan section) Progress towards PT goals:  Progressing toward goals    Frequency    Min 4X/week      PT Plan Current plan remains appropriate    Co-evaluation              AM-PAC PT "6 Clicks" Mobility   Outcome Measure  Help needed turning from your back to your side while in a flat bed without using bedrails?: None Help needed moving from lying on your back to sitting on the side of a flat bed without using bedrails?: None Help needed moving to and from a bed to a chair (including a wheelchair)?: None Help needed standing up from a chair using your arms (e.g., wheelchair or bedside chair)?: None Help needed to walk in hospital room?: A Little Help needed climbing 3-5 steps with a railing? : A Little 6 Click Score: 22    End of Session Equipment Utilized During Treatment: Gait belt Activity Tolerance: Patient tolerated treatment well Patient left: with call bell/phone within reach;in bed;with bed alarm set Nurse Communication: Mobility status PT Visit Diagnosis: Unsteadiness on feet (R26.81);Muscle weakness (generalized) (M62.81)     Time: 1287-8676 PT Time Calculation (min) (ACUTE ONLY): 30 min  Charges:  $Gait Training: 8-22 mins $Therapeutic Exercise: 8-22 mins                     Maggie Font, PT Acute Rehab Services Pager [phone] [facility] [phone]    Karlton Lemon 06/09/2019, 2:09 PM

## 2019-06-09 NOTE — Discharge Summary (Signed)
Physician Discharge Summary  Lauren Wang WFU:[id number] DOB: 1960-06-17 DOA: 05/31/2019  PCP: Nolene Ebbs, MD  Admit date: 05/31/2019 Discharge date: 06/09/2019  Admitted From: Home.  Disposition:  [facility].    Recommendations for Outpatient Follow-up:  Follow up with PCP in 1-2 weeks Please obtain BMP/CBC in one week Discharge to SNF with PT/OT Check FSBS twice daily and record values. Take record of glucose levels in to visit with PCP. Follow up with Neurology in 4-6 weeks. Follow up with Vascular surgery in one week or as directed.     Discharge Condition: stable.  CODE STATUS: full code.  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  On arrival to ED she was found to be in HHS.  MRI negative for any acute or subacute infarction. Carotid duplex was performed and showed high-grade stenosis of the right ICA , -Critical 80 to 99% right ICA stenosis by carotid duplex; left ICA 40 to 59% stenosis, and it was followed by CTA of the head and neck showing Mild calcified plaque at the common carotid bifurcations and internal carotid origins. Vascular surgery consulted and she underwent right carotid endarterectomy with Dacron patch angioplasty by Dr. Donnetta Hutching  On 06/08/2019.   Discharge Diagnoses:  Active Problems:   Hyperosmolar hyperglycemic state (HHS) ([place])   CVA (cerebral vascular accident) ([place])   HHS  Hemoglobin A1c is 15.1 She was initially started on insulin drip with normalization of CBGs. Currently on 50 units of Lantus, to be resumed  Continue with sliding scale insulin. CBG (last 3)  Recent Labs    06/09/19 0406 06/09/19 0810 06/09/19 1156  GLUCAP 170* 87 227*       Essential hypertension Resume home medications.               Critical 80 to 99% right ICA stenosis by carotid duplex;               Would explain her leg weakness and slurred speech.MRI is negative for acute CVA.  Vascular surgery consulted and she underwent right carotid endarterectomy with Dacron patch angioplasty by Dr. Donnetta Hutching on 06/08/2019.   Hypokalemia Replaced.   Mild acute on stage III CKD Creatinine back to baseline.    Mild normocytic anemia  hemoglobin around 11.  Continue to monitor   COPD No wheezing heard       Discharge Instructions  Discharge Instructions    Activity as tolerated - No restrictions   Complete by: As directed    Call MD for:   Complete by: As directed    Neurological changes.   Call MD for:  persistant nausea and vomiting   Complete by: As directed    Call MD for:  severe uncontrolled pain   Complete by: As directed    Diet - low sodium heart healthy   Complete by: As directed    Diet Carb Modified   Complete by: As directed    Discharge instructions   Complete by: As directed    Discharge to SNF with PT/OT Check FSBS twice daily and record values. Take record of glucoses in to visit with PCP. Follow up with PCP in 7-10 days after discharge from SNF. Follow up with Neurology in 4-6 weeks. Follow up  with Vascular Surgery in one week or as directed.   Increase activity slowly   Complete by: As directed      Allergies as of 06/09/2019      Reactions   Other Itching   Patient states sulfa causes her yeast infection   Tape Hives      Medication List    STOP taking these medications   amLODipine 10 MG tablet Commonly known as: NORVASC   doxycycline 100 MG tablet Commonly known as: VIBRA-TABS   lisinopril 10 MG tablet Commonly known as: ZESTRIL   zolpidem 10 MG tablet Commonly known as: AMBIEN Replaced by: temazepam 15 MG capsule     TAKE these medications   acetaminophen 325 MG  tablet Commonly known as: TYLENOL Take 650 mg by mouth every 6 (six) hours as needed for pain.   albuterol 108 (90 Base) MCG/ACT inhaler Commonly known as: VENTOLIN HFA Inhale 2 puffs into the lungs every 6 (six) hours as needed for wheezing or shortness of breath.   aspirin 81 MG EC tablet Take 1 tablet (81 mg total) by mouth daily at 6 (six) AM. Start taking on: June 10, 2019   Buprenorphine 7.5 MCG/HR Ptwk Place 1 patch onto the skin once a week. Friday   diphenhydrAMINE 25 MG tablet Commonly known as: SOMINEX Take 25 mg by mouth every 6 (six) hours as needed for itching.   docusate sodium 100 MG capsule Commonly known as: COLACE Take 1 capsule (100 mg total) by mouth daily. Start taking on: June 10, 2019   guaiFENesin-dextromethorphan 100-10 MG/5ML syrup Commonly known as: ROBITUSSIN DM Take 15 mLs by mouth every 4 (four) hours as needed for cough.   Lantus SoloStar 100 UNIT/ML Solostar Pen Generic drug: insulin glargine Inject 50 Units into the skin daily.   LORazepam 0.5 MG tablet Commonly known as: Ativan Take 1 tablet (0.5 mg total) by mouth every 6 (six) hours as needed for anxiety.   nystatin 100000 UNIT/ML suspension Commonly known as: MYCOSTATIN Take 5 mLs by mouth every 6 (six) hours.   Oncovite Tabs Take 1 tablet by mouth daily.   pantoprazole 40 MG tablet Commonly known as: PROTONIX Take 1 tablet (40 mg total) by mouth daily. Start taking on: June 10, 2019   potassium chloride SA 20 MEQ tablet Commonly known as: KLOR-CON Take 1-2 tablets (20-40 mEq total) by mouth daily as needed (low potassium level).   rosuvastatin 40 MG tablet Commonly known as: CRESTOR Take 1 tablet (40 mg total) by mouth daily. What changed:   medication strength  how much to take   sodium bicarbonate 650 MG tablet Take 1 tablet (650 mg total) by mouth 3 (three) times daily.   temazepam 15 MG capsule Commonly known as: RESTORIL Take 1 capsule (15 mg total) by  mouth at bedtime as needed for sleep. Replaces: zolpidem 10 MG tablet            Durable Medical Equipment  (From admission, onward)         Start     Ordered   06/06/19 1113  DME Walker  Once    Question Answer Comment  Walker: With 5 Inch Wheels   Patient needs a walker to treat with the following condition Ambulatory dysfunction      06/06/19 1123          Contact information for follow-up providers    Early, Arvilla Meres, MD In 3 weeks.   Specialties: Vascular Surgery, Cardiology Why: Office will  call you to arrange your appt (sent) Contact information: [place] [address] [phone]            Contact information for after-discharge care    Destination    [facility] .   Service: Skilled Nursing Contact information: [place] [address] [phone]                 Allergies  Allergen Reactions  . Other Itching    Patient states sulfa causes her yeast infection  . Tape Hives    Consultations:  VASCULAR SURGERY.    Procedures/Studies: CT Head Wo Contrast  Result Date: 05/31/2019 CLINICAL DATA:  Neuro deficits, subacute. Additional history provided: Patient reports the emergency department with report of left leg tingling, polyuria, polydipsia and weakness since Saturday EXAM: CT HEAD WITHOUT CONTRAST TECHNIQUE: Contiguous axial images were obtained from the base of the skull through the vertex without intravenous contrast. COMPARISON:  No pertinent prior studies available for comparison. FINDINGS: Brain: There are small age-indeterminate cortically based infarcts within the left frontal lobe motor strip and high left parietal lobe, possibly acute (series 3, image 25) (series 3, image 26). There is an additional small cortical/subcortical infarct within the high left parietal lobe which may be subacute or chronic (series 3, image 26). Additional mild scattered hypodensity within the  cerebral white matter is nonspecific, but consistent with chronic small vessel ischemic disease. No evidence of acute intracranial hemorrhage. No evidence of intracranial mass. No midline shift or extra-axial fluid collection. Cerebral volume is normal for age. Vascular: No hyperdense vessel Skull: Normal. Negative for fracture or focal lesion. Sinuses/Orbits: Visualized orbits demonstrate no acute abnormality. Minimal scattered paranasal sinus mucosal thickening. Trace right mastoid effusion. IMPRESSION: Small age-indeterminate cortically based infarcts within the left frontal lobe motor strip and high left parietal lobe, possibly acute. Additional small cortical/subcortical infarct within the high left parietal lobe which may be subacute or chronic. Consider brain MRI for further evaluation, as clinically warranted. Background mild chronic small vessel ischemic disease. Electronically Signed   By: Kellie Simmering DO   On: 05/31/2019 15:06   CT SOFT TISSUE NECK WO CONTRAST  Result Date: 06/06/2019 CLINICAL DATA:  Carotid artery stenosis EXAM: CT NECK WITHOUT CONTRAST TECHNIQUE: Multidetector CT imaging of the neck was performed following the standard protocol without intravenous contrast. COMPARISON:  None. FINDINGS: Pharynx and larynx: Unremarkable.  No mass or swelling. Salivary glands: Unremarkable. Thyroid: Normal. Lymph nodes: No enlarged lymph nodes. Vascular: Mild calcified plaque at the CCA bifurcations and ICA origins. Limited intracranial: No acute abnormality. Visualized orbits: Unremarkable. Mastoids and visualized paranasal sinuses: Aerated Skeleton: Postoperative changes anterior fusion at C4-C6. Chronic appearing endplate irregularity posterior osteophyte formation C6-C7 causing moderate canal stenosis. Upper chest: Emphysema.  Apical scarring. Other: None. IMPRESSION: Mild calcified plaque at the common carotid bifurcations and internal carotid origins. Electronically Signed   By: Macy Mis  M.D.   On: 06/06/2019 09:14   MR BRAIN WO CONTRAST  Result Date: 05/31/2019 CLINICAL DATA:  Stroke presentation today with tingling of the left leg. EXAM: MRI HEAD WITHOUT CONTRAST TECHNIQUE: Multiplanar, multiecho pulse sequences of the brain and surrounding structures were obtained without intravenous contrast. COMPARISON:  Head CT same day FINDINGS: Brain: Diffusion imaging does not show any acute or subacute infarction. No focal abnormality affects the brainstem or cerebellum. Cerebral hemispheres show moderate chronic small-vessel ischemic changes of the deep and subcortical white matter. Old small vessel infarctions of the  left caudate. There are small cortical infarctions at the frontoparietal vertex, left more than right. Vascular: Major vessels at the base of the brain show flow. Skull and upper cervical spine: Negative Sinuses/Orbits: Clear/normal Other: None IMPRESSION: No acute or subacute infarction. Moderate chronic small-vessel ischemic changes affecting the cerebral hemispheres as above. Small cortical infarctions at the frontoparietal vertex regions, left more than right. Electronically Signed   By: Nelson Chimes M.D.   On: 05/31/2019 18:19   DG Chest Port 1 View  Result Date: 05/31/2019 CLINICAL DATA:  Weakness. Additional history provided: Patient reports left leg tingling, polyuria, polydipsia, weakness since Saturday. EXAM: PORTABLE CHEST 1 VIEW COMPARISON:  No pertinent prior studies available for comparison. FINDINGS: Heart size within normal limits. There is no airspace consolidation within the lungs. No evidence of pleural effusion or pneumothorax. No acute bony abnormality.  Partially visualized ACDF hardware. Overlying cardiac monitoring leads. IMPRESSION: No evidence of acute cardiopulmonary abnormality. Electronically Signed   By: Kellie Simmering DO   On: 05/31/2019 13:51   ECHOCARDIOGRAM COMPLETE  Result Date: 06/01/2019    ECHOCARDIOGRAM REPORT   Patient Name:   OVETA IDRIS  Date of Exam: 06/01/2019 Medical Rec #:  [id number]    Height:       66.0 in Accession #:    [id number]   Weight:       175.0 lb Date of Birth:  November 01, 1960   BSA:          1.889 m Patient Age:    63 years     BP:           124/69 mmHg Patient Gender: F            HR:           64 bpm. Exam Location:  Inpatient Procedure: 2D Echo Indications:    435.9 TIA  History:        Patient has no prior history of Echocardiogram examinations.                 Stroke and COPD; Risk Factors:Hypertension, Diabetes and Current                 Smoker.  Sonographer:    Jannett Celestine RDCS (AE) Referring Phys: [id number] Lequita Halt IMPRESSIONS  1. Left ventricular ejection fraction, by estimation, is 60 to 65%. The left ventricle has normal function. The left ventricle has no regional wall motion abnormalities. There is mild left ventricular hypertrophy. Left ventricular diastolic parameters are indeterminate.  2. Right ventricular systolic function is normal. The right ventricular size is normal. There is mildly elevated pulmonary artery systolic pressure.  3. The mitral valve is normal in structure. No evidence of mitral valve regurgitation.  4. The aortic valve was not well visualized. Aortic valve regurgitation is trivial. No aortic stenosis is present.  5. The inferior vena cava is normal in size with greater than 50% respiratory variability, suggesting right atrial pressure of 3 mmHg. FINDINGS  Left Ventricle: Left ventricular ejection fraction, by estimation, is 60 to 65%. The left ventricle has normal function. The left ventricle has no regional wall motion abnormalities. The left ventricular internal cavity size was normal in size. There is  mild left ventricular hypertrophy. Left ventricular diastolic parameters are indeterminate. Right Ventricle: The right ventricular size is normal. Right vetricular wall thickness was not assessed. Right ventricular systolic function is normal. There is mildly elevated pulmonary artery  systolic pressure. The tricuspid regurgitant velocity is 2.62 m/s,  and with an assumed right atrial pressure of 3 mmHg, the estimated right ventricular systolic pressure is 76.5 mmHg. Left Atrium: Left atrial size was normal in size. Right Atrium: Right atrial size was normal in size. Pericardium: There is no evidence of pericardial effusion. Mitral Valve: The mitral valve is normal in structure. No evidence of mitral valve regurgitation. Tricuspid Valve: The tricuspid valve is normal in structure. Tricuspid valve regurgitation is trivial. Aortic Valve: The aortic valve was not well visualized. Aortic valve regurgitation is trivial. No aortic stenosis is present. Pulmonic Valve: The pulmonic valve was not well visualized. Pulmonic valve regurgitation is not visualized. Aorta: The aortic root is normal in size and structure. Venous: The inferior vena cava is normal in size with greater than 50% respiratory variability, suggesting right atrial pressure of 3 mmHg. IAS/Shunts: The interatrial septum was not well visualized.  LEFT VENTRICLE PLAX 2D LVIDd:         4.23 cm  Diastology LVIDs:         2.74 cm  LV e' lateral:   7.29 cm/s LV PW:         0.97 cm  LV E/e' lateral: 10.9 LV IVS:        0.75 cm  LV e' medial:    4.68 cm/s LVOT diam:     2.00 cm  LV E/e' medial:  17.0 LV SV:         61 LV SV Index:   32 LVOT Area:     3.14 cm  RIGHT VENTRICLE RV S prime:     10.90 cm/s TAPSE (M-mode): 1.5 cm LEFT ATRIUM             Index       RIGHT ATRIUM           Index LA diam:        2.90 cm 1.53 cm/m  RA Area:     10.30 cm LA Vol (A2C):   26.4 ml 13.97 ml/m RA Volume:   19.00 ml  10.06 ml/m LA Vol (A4C):   10.8 ml 5.72 ml/m LA Biplane Vol: 18.4 ml 9.74 ml/m  AORTIC VALVE LVOT Vmax:   101.00 cm/s LVOT Vmean:  67.400 cm/s LVOT VTI:    0.193 m  AORTA Ao Root diam: 2.50 cm MITRAL VALVE               TRICUSPID VALVE MV Area (PHT): 2.73 cm    TR Peak grad:   27.5 mmHg MV Decel Time: 278 msec    TR Vmax:        262.00 cm/s MV  E velocity: 79.70 cm/s MV A velocity: 88.30 cm/s  SHUNTS MV E/A ratio:  0.90        Systemic VTI:  0.19 m                            Systemic Diam: 2.00 cm Oswaldo Milian MD Electronically signed by Oswaldo Milian MD Signature Date/Time: 06/01/2019/1:41:23 PM    Final    VAS US CAROTID (at [facility] and [facility] only)  Result Date: 06/01/2019 Carotid Arterial Duplex Study Indications:       CVA. Risk Factors:      Hypertension, Diabetes. Limitations        Today's exam was limited due to the high bifurcation of the                    carotid.  Comparison Study:  No prior exam. Performing Technologist: Baldwin Crown ARDMS, RVT  Examination Guidelines: A complete evaluation includes B-mode imaging, spectral Doppler, color Doppler, and power Doppler as needed of all accessible portions of each vessel. Bilateral testing is considered an integral part of a complete examination. Limited examinations for reoccurring indications may be performed as noted.  Right Carotid Findings: +----------+--------+--------+--------+------------------+------------------+           PSV cm/sEDV cm/sStenosisPlaque DescriptionComments           +----------+--------+--------+--------+------------------+------------------+ CCA Prox  89      9                                                    +----------+--------+--------+--------+------------------+------------------+ CCA Distal76      12              smooth            intimal thickening +----------+--------+--------+--------+------------------+------------------+ ICA Prox  80      19                                                   +----------+--------+--------+--------+------------------+------------------+ ICA Mid   462     137     80-99%  heterogenous                         +----------+--------+--------+--------+------------------+------------------+ ICA Distal162     42                                                    +----------+--------+--------+--------+------------------+------------------+ ECA       126     14                                                   +----------+--------+--------+--------+------------------+------------------+ +----------+--------+-------+----------------+-------------------+           PSV cm/sEDV cmsDescribe        Arm Pressure (mmHG) +----------+--------+-------+----------------+-------------------+ Subclavian204            Multiphasic, WNL                    +----------+--------+-------+----------------+-------------------+ +---------+--------+--+--------+--+---------+ VertebralPSV cm/s81EDV cm/s15Antegrade +---------+--------+--+--------+--+---------+  Left Carotid Findings: +----------+--------+--------+--------+------------------+------------------+           PSV cm/sEDV cm/sStenosisPlaque DescriptionComments           +----------+--------+--------+--------+------------------+------------------+ CCA Prox  131     28                                intimal thickening +----------+--------+--------+--------+------------------+------------------+ CCA Distal133     24                                                   +----------+--------+--------+--------+------------------+------------------+  ICA Prox  159     31      40-59%  heterogenous                         +----------+--------+--------+--------+------------------+------------------+ ICA Distal157     48                                                   +----------+--------+--------+--------+------------------+------------------+ ECA       177     18                                                   +----------+--------+--------+--------+------------------+------------------+ +----------+--------+--------+----------------+-------------------+           PSV cm/sEDV cm/sDescribe        Arm Pressure (mmHG) +----------+--------+--------+----------------+-------------------+  [id number]             Multiphasic, WNL                    +----------+--------+--------+----------------+-------------------+ +---------+--------+--+--------+--+---------+ VertebralPSV cm/s57EDV cm/s18Antegrade +---------+--------+--+--------+--+---------+   Summary: Right Carotid: Velocities in the right ICA are consistent with a 80-99%                stenosis. Non-hemodynamically significant plaque <50% noted in                the CCA. Left Carotid: Velocities in the left ICA are consistent with a 40-59% stenosis. Vertebrals:  Bilateral vertebral arteries demonstrate antegrade flow. Subclavians: Normal flow hemodynamics were seen in bilateral subclavian              arteries. *See table(s) above for measurements and observations.  Electronically signed by Curt Jews MD on 06/01/2019 at 3:49:19 PM.    Final       Subjective: PAIN CONTROLLED. No chest pain or sob.   Discharge Exam: Vitals:   06/09/19 0811 06/09/19 1157  BP: (!) 173/88 125/70  Pulse: 74 73  Resp: 11 14  Temp: 98 F (36.7 C) 98.3 F (36.8 C)  SpO2: 99% 99%   Vitals:   06/09/19 0400 06/09/19 0600 06/09/19 0811 06/09/19 1157  BP: 136/69 129/75 (!) 173/88 125/70  Pulse:  68 74 73  Resp: 15 12 11 14   Temp:   98 F (36.7 C) 98.3 F (36.8 C)  TempSrc:   Oral Oral  SpO2:  95% 99% 99%  Weight:      Height:        General: Pt is alert, awake, not in acute distress Cardiovascular: RRR, S1/S2 +,  Respiratory: CTA bilaterally, no wheezing, no rhonchi Abdominal: Soft, NT, ND, bowel sounds + Extremities: no edema, no cyanosis    The results of significant diagnostics from this hospitalization (including imaging, microbiology, ancillary and laboratory) are listed below for reference.     Microbiology: Recent Results (from the past 240 hour(s))  SARS CORONAVIRUS 2 (TAT 6-24 HRS) Nasopharyngeal Nasopharyngeal Swab     Status: None   Collection Time: 06/03/19  3:07 PM   Specimen: Nasopharyngeal Swab   Result Value Ref Range Status   SARS Coronavirus 2 NEGATIVE NEGATIVE Final    Comment: (NOTE) SARS-CoV-2 target nucleic acids are NOT DETECTED. The SARS-CoV-2 RNA  is generally detectable in upper and lower respiratory specimens during the acute phase of infection. Negative results do not preclude SARS-CoV-2 infection, do not rule out co-infections with other pathogens, and should not be used as the sole basis for treatment or other patient management decisions. Negative results must be combined with clinical observations, patient history, and epidemiological information. The expected result is Negative. Fact Sheet for Patients: SugarRoll.be Fact Sheet for Healthcare Providers: https://www.woods-mathews.com/ This test is not yet approved or cleared by the [place] FDA and  has been authorized for detection and/or diagnosis of SARS-CoV-2 by FDA under an Emergency Use Authorization (EUA). This EUA will remain  in effect (meaning this test can be used) for the duration of the COVID-19 declaration under Section 56 4(b)(1) of the Act, 21 U.S.C. section 360bbb-3(b)(1), unless the authorization is terminated or revoked sooner. Performed at [facility], [place] [address] [place], [place] [address]   Surgical pcr screen     Status: None   Collection Time: 06/07/19  7:51 PM   Specimen: Nasal Mucosa; Nasal Swab  Result Value Ref Range Status   MRSA, PCR NEGATIVE NEGATIVE Final   Staphylococcus aureus NEGATIVE NEGATIVE Final    Comment: (NOTE) The Xpert SA Assay (FDA approved for NASAL specimens in patients 62 years of age and older), is one component of a comprehensive surveillance program. It is not intended to diagnose infection nor to guide or monitor treatment. Performed at [facility], [place] [address] [place], [place] [address]   SARS CORONAVIRUS 2 (TAT 6-24 HRS) Nasopharyngeal Nasopharyngeal Swab     Status: None    Collection Time: 06/08/19  8:14 PM   Specimen: Nasopharyngeal Swab  Result Value Ref Range Status   SARS Coronavirus 2 NEGATIVE NEGATIVE Final    Comment: (NOTE) SARS-CoV-2 target nucleic acids are NOT DETECTED. The SARS-CoV-2 RNA is generally detectable in upper and lower respiratory specimens during the acute phase of infection. Negative results do not preclude SARS-CoV-2 infection, do not rule out co-infections with other pathogens, and should not be used as the sole basis for treatment or other patient management decisions. Negative results must be combined with clinical observations, patient history, and epidemiological information. The expected result is Negative. Fact Sheet for Patients: SugarRoll.be Fact Sheet for Healthcare Providers: https://www.woods-mathews.com/ This test is not yet approved or cleared by the [place] FDA and  has been authorized for detection and/or diagnosis of SARS-CoV-2 by FDA under an Emergency Use Authorization (EUA). This EUA will remain  in effect (meaning this test can be used) for the duration of the COVID-19 declaration under Section 56 4(b)(1) of the Act, 21 U.S.C. section 360bbb-3(b)(1), unless the authorization is terminated or revoked sooner. Performed at [facility], [place] [address] [place], [place] [address]      Labs: BNP (last 3 results) No results for input(s): BNP in the last 8760 hours. Basic Metabolic Panel: Recent Labs  Lab 06/03/19 0328 06/04/19 0445 06/05/19 0528 06/08/19 0432 06/09/19 0449  NA 138 135 135 138 140  K 3.9 4.2 4.0 4.4 4.9  CL 107 108 108 108 109  CO2 18* 17* 17* 21* 19*  GLUCOSE 242* 318* 273* 144* 160*  BUN 39* 42* 42* 41* 32*  CREATININE 2.36* 2.45* 2.29* 2.26* 2.42*  CALCIUM 9.0 8.7* 8.8* 9.3 9.0  MG 1.9  --   --   --   --   PHOS 3.0  --   --   --   --  Liver Function Tests: No results for input(s): AST, ALT, ALKPHOS, BILITOT, PROT,  ALBUMIN in the last 168 hours. No results for input(s): LIPASE, AMYLASE in the last 168 hours. No results for input(s): AMMONIA in the last 168 hours. CBC: Recent Labs  Lab 06/08/19 0432 06/09/19 0449  WBC 12.8* 16.3*  HGB 11.0* 10.6*  HCT 35.0* 35.2*  MCV 87.3 91.4  PLT 336 297   Cardiac Enzymes: No results for input(s): CKTOTAL, CKMB, CKMBINDEX, TROPONINI in the last 168 hours. BNP: Invalid input(s): POCBNP CBG: Recent Labs  Lab 06/08/19 2059 06/09/19 0019 06/09/19 0406 06/09/19 0810 06/09/19 1156  GLUCAP 202* 296* 170* 87 227*   D-Dimer No results for input(s): DDIMER in the last 72 hours. Hgb A1c No results for input(s): HGBA1C in the last 72 hours. Lipid Profile No results for input(s): CHOL, HDL, LDLCALC, TRIG, CHOLHDL, LDLDIRECT in the last 72 hours. Thyroid function studies No results for input(s): TSH, T4TOTAL, T3FREE, THYROIDAB in the last 72 hours.  Invalid input(s): FREET3 Anemia work up No results for input(s): VITAMINB12, FOLATE, FERRITIN, TIBC, IRON, RETICCTPCT in the last 72 hours. Urinalysis    Component Value Date/Time   COLORURINE STRAW (A) 05/31/2019 1429   APPEARANCEUR CLEAR 05/31/2019 1429   LABSPEC 1.020 05/31/2019 1429   PHURINE 6.0 05/31/2019 1429   GLUCOSEU >=500 (A) 05/31/2019 1429   HGBUR NEGATIVE 05/31/2019 1429   BILIRUBINUR NEGATIVE 05/31/2019 1429   KETONESUR NEGATIVE 05/31/2019 1429   PROTEINUR 100 (A) 05/31/2019 1429   NITRITE NEGATIVE 05/31/2019 1429   LEUKOCYTESUR NEGATIVE 05/31/2019 1429   Sepsis Labs Invalid input(s): PROCALCITONIN,  WBC,  LACTICIDVEN Microbiology Recent Results (from the past 240 hour(s))  SARS CORONAVIRUS 2 (TAT 6-24 HRS) Nasopharyngeal Nasopharyngeal Swab     Status: None   Collection Time: 06/03/19  3:07 PM   Specimen: Nasopharyngeal Swab  Result Value Ref Range Status   SARS Coronavirus 2 NEGATIVE NEGATIVE Final    Comment: (NOTE) SARS-CoV-2 target nucleic acids are NOT DETECTED. The  SARS-CoV-2 RNA is generally detectable in upper and lower respiratory specimens during the acute phase of infection. Negative results do not preclude SARS-CoV-2 infection, do not rule out co-infections with other pathogens, and should not be used as the sole basis for treatment or other patient management decisions. Negative results must be combined with clinical observations, patient history, and epidemiological information. The expected result is Negative. Fact Sheet for Patients: SugarRoll.be Fact Sheet for Healthcare Providers: https://www.woods-mathews.com/ This test is not yet approved or cleared by the [place] FDA and  has been authorized for detection and/or diagnosis of SARS-CoV-2 by FDA under an Emergency Use Authorization (EUA). This EUA will remain  in effect (meaning this test can be used) for the duration of the COVID-19 declaration under Section 56 4(b)(1) of the Act, 21 U.S.C. section 360bbb-3(b)(1), unless the authorization is terminated or revoked sooner. Performed at [facility], [place] [address], [place],  [address]   Surgical pcr screen     Status: None   Collection Time: 06/07/19  7:51 PM   Specimen: Nasal Mucosa; Nasal Swab  Result Value Ref Range Status   MRSA, PCR NEGATIVE NEGATIVE Final   Staphylococcus aureus NEGATIVE NEGATIVE Final    Comment: (NOTE) The Xpert SA Assay (FDA approved for NASAL specimens in patients 78 years of age and older), is one component of a comprehensive surveillance program. It is not intended to diagnose infection nor to guide or monitor treatment. Performed at [facility],  [address], [place], [place] [address]   SARS CORONAVIRUS 2 (TAT 6-24 HRS) Nasopharyngeal Nasopharyngeal Swab     Status: None   Collection Time: 06/08/19  8:14 PM   Specimen: Nasopharyngeal Swab  Result Value Ref Range Status   SARS Coronavirus 2 NEGATIVE NEGATIVE Final    Comment:  (NOTE) SARS-CoV-2 target nucleic acids are NOT DETECTED. The SARS-CoV-2 RNA is generally detectable in upper and lower respiratory specimens during the acute phase of infection. Negative results do not preclude SARS-CoV-2 infection, do not rule out co-infections with other pathogens, and should not be used as the sole basis for treatment or other patient management decisions. Negative results must be combined with clinical observations, patient history, and epidemiological information. The expected result is Negative. Fact Sheet for Patients: SugarRoll.be Fact Sheet for Healthcare Providers: https://www.woods-mathews.com/ This test is not yet approved or cleared by the [place] FDA and  has been authorized for detection and/or diagnosis of SARS-CoV-2 by FDA under an Emergency Use Authorization (EUA). This EUA will remain  in effect (meaning this test can be used) for the duration of the COVID-19 declaration under Section 56 4(b)(1) of the Act, 21 U.S.C. section 360bbb-3(b)(1), unless the authorization is terminated or revoked sooner. Performed at [facility], [place] [address] [place], [place] [address]      Time coordinating discharge: 34  minutes  SIGNED:   Hosie Poisson, MD  [facility] 06/09/2019, 1:53 PM

## 2019-06-09 NOTE — Progress Notes (Signed)
D/C to SNF alert and oriented via PTAR.

## 2019-06-10 NOTE — Anesthesia Postprocedure Evaluation (Signed)
Anesthesia Post Note  Patient: Lauren Wang  Procedure(s) Performed: RIGHT ENDARTERECTOMY CAROTID (Right )     Patient location during evaluation: PACU Anesthesia Type: General Level of consciousness: awake and alert Pain management: pain level controlled Vital Signs Assessment: post-procedure vital signs reviewed and stable Respiratory status: spontaneous breathing, nonlabored ventilation, respiratory function stable and patient connected to nasal cannula oxygen Cardiovascular status: blood pressure returned to baseline and stable Postop Assessment: no apparent nausea or vomiting Anesthetic complications: no    Last Vitals:  Vitals:   06/09/19 1157 06/09/19 2021  BP: 125/70 (!) 150/80  Pulse: 73 68  Resp: 14 16  Temp: 36.8 C 36.9 C  SpO2: 99% 99%    Last Pain:  Vitals:   06/09/19 2021  TempSrc: Oral  PainSc:                  [place]

## 2019-06-12 MED FILL — Buprenorphine TD Patch Weekly 7.5 MCG/HR: TRANSDERMAL | Qty: 1 | Status: AC

## 2019-06-13 MED FILL — Buprenorphine TD Patch Weekly 7.5 MCG/HR: TRANSDERMAL | Qty: 1 | Status: AC

## 2019-06-21 ENCOUNTER — Ambulatory Visit (INDEPENDENT_AMBULATORY_CARE_PROVIDER_SITE_OTHER): Payer: Self-pay | Admitting: Vascular Surgery

## 2019-06-21 ENCOUNTER — Other Ambulatory Visit: Payer: Self-pay

## 2019-06-21 ENCOUNTER — Encounter: Payer: Self-pay | Admitting: Vascular Surgery

## 2019-06-21 VITALS — BP 150/92 | HR 79 | Temp 97.9°F | Resp 20 | Ht 66.0 in | Wt 167.0 lb

## 2019-06-21 DIAGNOSIS — I6521 Occlusion and stenosis of right carotid artery: Secondary | ICD-10-CM

## 2019-06-21 NOTE — Progress Notes (Signed)
Patient name: Lauren Wang MRN: [id number] DOB: [birth year] Sex: female  REASON FOR VISIT: Follow-up recent right carotid endarterectomy for symptomatic disease  HPI: Lauren Wang is a 59 y.o. female here today for follow-up.  She was admitted to [facility] with stroke.  Work-up showed high-grade right carotid stenosis.  She had recovered from her stroke and had uneventful right carotid endarterectomy prior to discharge.  She reports some continued discomfort in the incision and also her right ear.  Current Outpatient Medications  Medication Sig Dispense Refill  . acetaminophen (TYLENOL) 325 MG tablet Take 650 mg by mouth every 6 (six) hours as needed for pain.    Marland Kitchen albuterol (VENTOLIN HFA) 108 (90 Base) MCG/ACT inhaler Inhale 2 puffs into the lungs every 6 (six) hours as needed for wheezing or shortness of breath.     Marland Kitchen amLODipine (NORVASC) 10 MG tablet Take 10 mg by mouth daily.    Marland Kitchen aspirin EC 81 MG EC tablet Take 1 tablet (81 mg total) by mouth daily at 6 (six) AM.    . Buprenorphine 7.5 MCG/HR PTWK Place 1 patch onto the skin once a week. Friday    . diphenhydrAMINE (SOMINEX) 25 MG tablet Take 25 mg by mouth every 6 (six) hours as needed for itching.    . docusate sodium (COLACE) 100 MG capsule Take 1 capsule (100 mg total) by mouth daily. 10 capsule 0  . guaiFENesin-dextromethorphan (ROBITUSSIN DM) 100-10 MG/5ML syrup Take 15 mLs by mouth every 4 (four) hours as needed for cough. 118 mL 0  . insulin glargine (LANTUS SOLOSTAR) 100 UNIT/ML Solostar Pen Inject 50 Units into the skin daily. 15 mL 11  . LORazepam (ATIVAN) 0.5 MG tablet Take 1 tablet (0.5 mg total) by mouth every 6 (six) hours as needed for anxiety. 10 tablet 0  . Multiple Vitamins-Minerals (ONCOVITE) TABS Take 1 tablet by mouth daily.    Marland Kitchen nystatin (MYCOSTATIN) 100000 UNIT/ML suspension Take 5 mLs by mouth every 6 (six) hours.    . pantoprazole (PROTONIX) 40 MG tablet Take 1 tablet  (40 mg total) by mouth daily. 30 tablet 0  . potassium chloride SA (KLOR-CON) 20 MEQ tablet Take 1-2 tablets (20-40 mEq total) by mouth daily as needed (low potassium level).    . rosuvastatin (CRESTOR) 40 MG tablet Take 1 tablet (40 mg total) by mouth daily. 30 tablet 0  . sodium bicarbonate 650 MG tablet Take 1 tablet (650 mg total) by mouth 3 (three) times daily. 90 tablet 0  . temazepam (RESTORIL) 15 MG capsule Take 1 capsule (15 mg total) by mouth at bedtime as needed for sleep. 30 capsule 0   No current facility-administered medications for this visit.     PHYSICAL EXAM: Vitals:   06/21/19 1104 06/21/19 1109  BP: (!) 155/97 (!) 150/92  Pulse: 79   Resp: 20   Temp: 97.9 F (36.6 C)   SpO2: 94%   Weight: 167 lb (75.8 kg)   Height: 5\' 6"  (1.676 m)     GENERAL: The patient is a well-nourished female, in no acute distress. The vital signs are documented above. Her right carotid incision is well-healed with some fullness in the incision as expected with her healing ridge.  Carotid arteries without bruits bilaterally  MEDICAL ISSUES: Stable postop right carotid endarterectomy for symptomatic disease Surgery was on 06/08/2019.  We will see her back in 9 months with repeat carotid duplex.  She will notify us should she develop any difficulty in the  interim  Rosetta Posner, MD FACS [facility] Tel [phone] Pager [phone]

## 2019-06-23 ENCOUNTER — Other Ambulatory Visit: Payer: Self-pay | Admitting: *Deleted

## 2019-06-23 DIAGNOSIS — I6521 Occlusion and stenosis of right carotid artery: Secondary | ICD-10-CM

## 2021-08-15 DIAGNOSIS — N2581 Secondary hyperparathyroidism of renal origin: Secondary | ICD-10-CM | POA: Diagnosis not present

## 2021-08-15 DIAGNOSIS — N185 Chronic kidney disease, stage 5: Secondary | ICD-10-CM | POA: Diagnosis not present

## 2021-08-15 DIAGNOSIS — E1121 Type 2 diabetes mellitus with diabetic nephropathy: Secondary | ICD-10-CM | POA: Diagnosis not present

## 2021-08-20 DIAGNOSIS — N185 Chronic kidney disease, stage 5: Secondary | ICD-10-CM | POA: Diagnosis not present

## 2021-08-20 DIAGNOSIS — R809 Proteinuria, unspecified: Secondary | ICD-10-CM | POA: Diagnosis not present

## 2021-08-20 DIAGNOSIS — J449 Chronic obstructive pulmonary disease, unspecified: Secondary | ICD-10-CM | POA: Diagnosis not present

## 2021-08-20 DIAGNOSIS — E559 Vitamin D deficiency, unspecified: Secondary | ICD-10-CM | POA: Diagnosis not present

## 2021-08-20 DIAGNOSIS — E1121 Type 2 diabetes mellitus with diabetic nephropathy: Secondary | ICD-10-CM | POA: Diagnosis not present

## 2021-08-20 DIAGNOSIS — F17200 Nicotine dependence, unspecified, uncomplicated: Secondary | ICD-10-CM | POA: Diagnosis not present

## 2021-08-20 DIAGNOSIS — I129 Hypertensive chronic kidney disease with stage 1 through stage 4 chronic kidney disease, or unspecified chronic kidney disease: Secondary | ICD-10-CM | POA: Diagnosis not present

## 2021-08-20 DIAGNOSIS — Z992 Dependence on renal dialysis: Secondary | ICD-10-CM | POA: Diagnosis not present

## 2021-08-20 DIAGNOSIS — E872 Acidosis, unspecified: Secondary | ICD-10-CM | POA: Diagnosis not present

## 2021-09-10 DIAGNOSIS — M160 Bilateral primary osteoarthritis of hip: Secondary | ICD-10-CM | POA: Diagnosis not present

## 2021-09-10 DIAGNOSIS — Z79891 Long term (current) use of opiate analgesic: Secondary | ICD-10-CM | POA: Diagnosis not present

## 2021-09-12 DIAGNOSIS — E559 Vitamin D deficiency, unspecified: Secondary | ICD-10-CM | POA: Diagnosis not present

## 2021-09-12 DIAGNOSIS — E119 Type 2 diabetes mellitus without complications: Secondary | ICD-10-CM | POA: Diagnosis not present

## 2021-09-12 DIAGNOSIS — G4700 Insomnia, unspecified: Secondary | ICD-10-CM | POA: Diagnosis not present

## 2021-09-12 DIAGNOSIS — E785 Hyperlipidemia, unspecified: Secondary | ICD-10-CM | POA: Diagnosis not present

## 2021-09-12 DIAGNOSIS — J449 Chronic obstructive pulmonary disease, unspecified: Secondary | ICD-10-CM | POA: Diagnosis not present

## 2021-09-12 DIAGNOSIS — F419 Anxiety disorder, unspecified: Secondary | ICD-10-CM | POA: Diagnosis not present

## 2021-09-12 DIAGNOSIS — G459 Transient cerebral ischemic attack, unspecified: Secondary | ICD-10-CM | POA: Diagnosis not present

## 2021-09-12 DIAGNOSIS — N186 End stage renal disease: Secondary | ICD-10-CM | POA: Diagnosis not present

## 2021-09-12 DIAGNOSIS — I10 Essential (primary) hypertension: Secondary | ICD-10-CM | POA: Diagnosis not present

## 2021-09-21 DIAGNOSIS — J4541 Moderate persistent asthma with (acute) exacerbation: Secondary | ICD-10-CM | POA: Diagnosis not present

## 2021-09-21 DIAGNOSIS — J45901 Unspecified asthma with (acute) exacerbation: Secondary | ICD-10-CM | POA: Diagnosis not present

## 2021-09-21 DIAGNOSIS — Z20822 Contact with and (suspected) exposure to covid-19: Secondary | ICD-10-CM | POA: Diagnosis not present

## 2021-09-21 DIAGNOSIS — J811 Chronic pulmonary edema: Secondary | ICD-10-CM | POA: Diagnosis not present

## 2021-10-03 DIAGNOSIS — M549 Dorsalgia, unspecified: Secondary | ICD-10-CM | POA: Diagnosis not present

## 2021-10-03 DIAGNOSIS — N185 Chronic kidney disease, stage 5: Secondary | ICD-10-CM | POA: Diagnosis not present

## 2021-10-03 DIAGNOSIS — G8929 Other chronic pain: Secondary | ICD-10-CM | POA: Diagnosis not present

## 2021-10-03 DIAGNOSIS — F17210 Nicotine dependence, cigarettes, uncomplicated: Secondary | ICD-10-CM | POA: Diagnosis not present

## 2021-10-03 DIAGNOSIS — I10 Essential (primary) hypertension: Secondary | ICD-10-CM | POA: Diagnosis not present

## 2021-10-03 DIAGNOSIS — M545 Low back pain, unspecified: Secondary | ICD-10-CM | POA: Diagnosis not present

## 2021-10-03 DIAGNOSIS — N2581 Secondary hyperparathyroidism of renal origin: Secondary | ICD-10-CM | POA: Diagnosis not present

## 2021-10-03 DIAGNOSIS — M25551 Pain in right hip: Secondary | ICD-10-CM | POA: Diagnosis not present

## 2021-10-03 DIAGNOSIS — I120 Hypertensive chronic kidney disease with stage 5 chronic kidney disease or end stage renal disease: Secondary | ICD-10-CM | POA: Diagnosis not present

## 2021-10-29 DIAGNOSIS — N185 Chronic kidney disease, stage 5: Secondary | ICD-10-CM | POA: Diagnosis not present

## 2021-10-29 DIAGNOSIS — E559 Vitamin D deficiency, unspecified: Secondary | ICD-10-CM | POA: Diagnosis not present

## 2021-10-29 DIAGNOSIS — E1122 Type 2 diabetes mellitus with diabetic chronic kidney disease: Secondary | ICD-10-CM | POA: Diagnosis not present

## 2021-10-29 DIAGNOSIS — I10 Essential (primary) hypertension: Secondary | ICD-10-CM | POA: Diagnosis not present

## 2021-11-12 DIAGNOSIS — Z79891 Long term (current) use of opiate analgesic: Secondary | ICD-10-CM | POA: Diagnosis not present

## 2021-11-12 DIAGNOSIS — M160 Bilateral primary osteoarthritis of hip: Secondary | ICD-10-CM | POA: Diagnosis not present

## 2021-11-14 DIAGNOSIS — E872 Acidosis, unspecified: Secondary | ICD-10-CM | POA: Diagnosis not present

## 2021-11-14 DIAGNOSIS — N179 Acute kidney failure, unspecified: Secondary | ICD-10-CM | POA: Diagnosis not present

## 2021-11-14 DIAGNOSIS — J449 Chronic obstructive pulmonary disease, unspecified: Secondary | ICD-10-CM | POA: Diagnosis not present

## 2021-11-14 DIAGNOSIS — N261 Atrophy of kidney (terminal): Secondary | ICD-10-CM | POA: Diagnosis not present

## 2021-11-14 DIAGNOSIS — N185 Chronic kidney disease, stage 5: Secondary | ICD-10-CM | POA: Diagnosis not present

## 2021-11-14 DIAGNOSIS — E1121 Type 2 diabetes mellitus with diabetic nephropathy: Secondary | ICD-10-CM | POA: Diagnosis not present

## 2021-11-14 DIAGNOSIS — I10 Essential (primary) hypertension: Secondary | ICD-10-CM | POA: Diagnosis not present

## 2021-11-18 IMAGING — CT CT NECK W/O CM
3 of 5 series · 14 of 33 positions shown, 17 images · non-contrast
Comparison: None.

CLINICAL DATA: Carotid artery stenosis

EXAM:
CT NECK WITHOUT CONTRAST
TECHNIQUE: Multidetector CT imaging of the neck was performed following the
standard protocol without intravenous contrast.

[Series 3: neck 2.0 i31s 3 · axial · 0.45mm/px · z∈[+1119,+1279]mm · 6 of 113 slices shown, 8 images]
[im 17/113  soft-tissue]
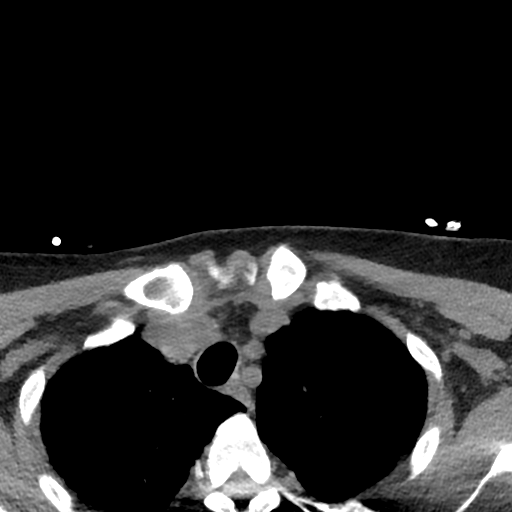
[im 17/113  bone]
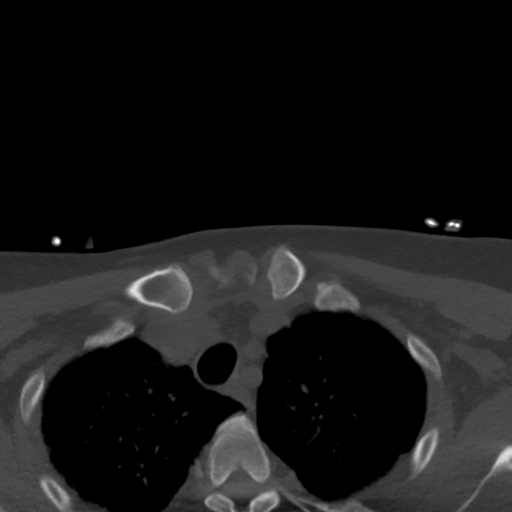
[im 33/113  bone]
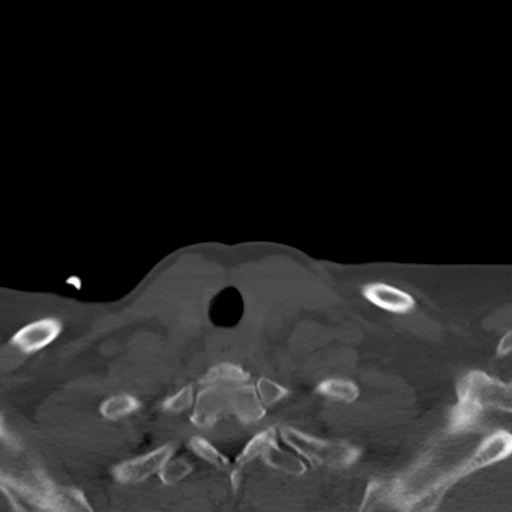
[im 49/113  bone]
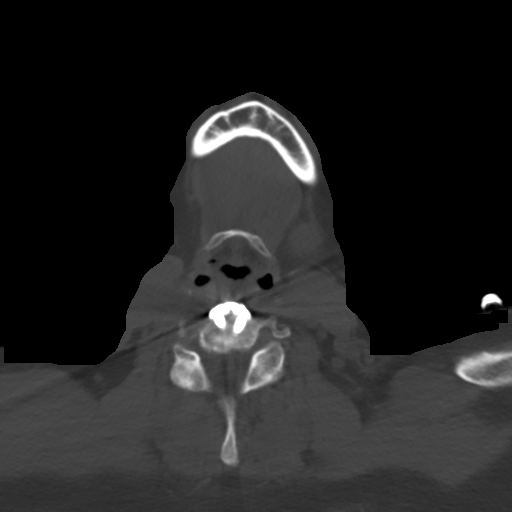
[im 65/113  bone]
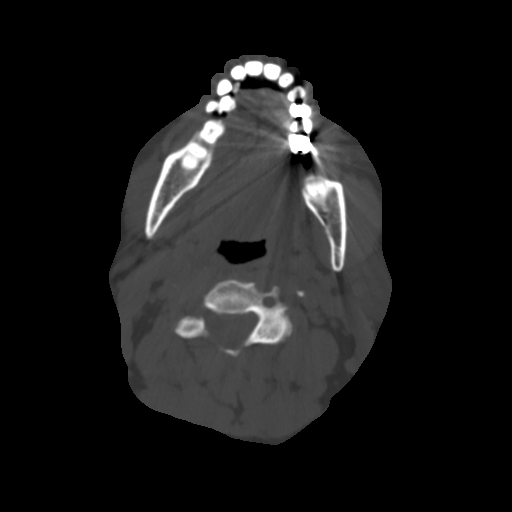
[im 81/113  soft-tissue]
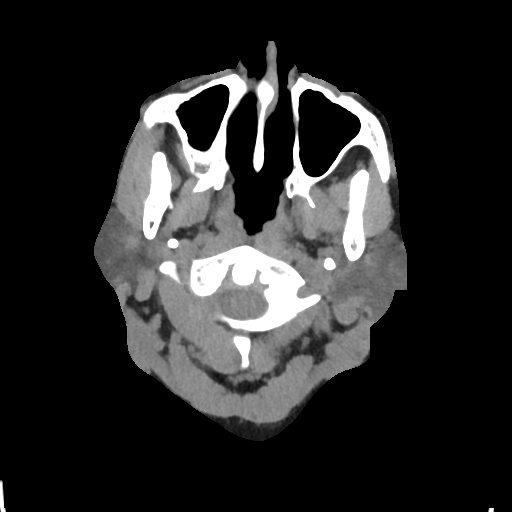
[im 81/113  bone]
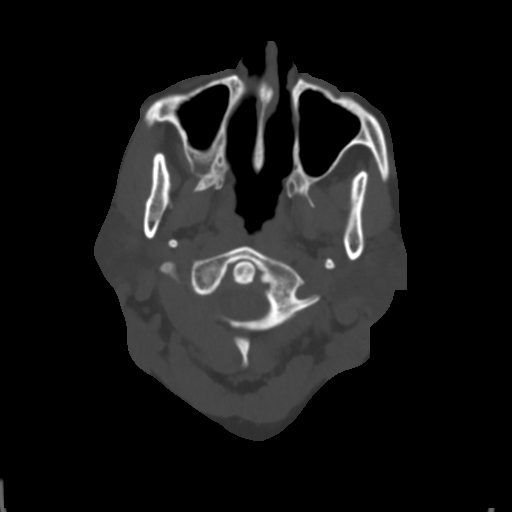
[im 97/113  bone]
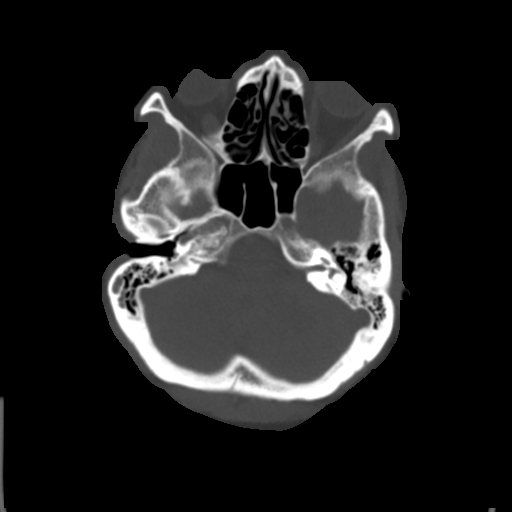

[Series 7: coronal st · coronal · 0.44mm/px · 3 of 125 slices shown]
[im 25/125  bone]
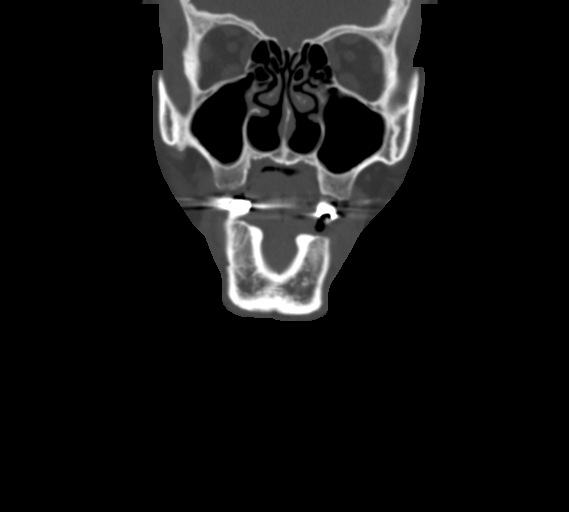
[im 50/125  bone]
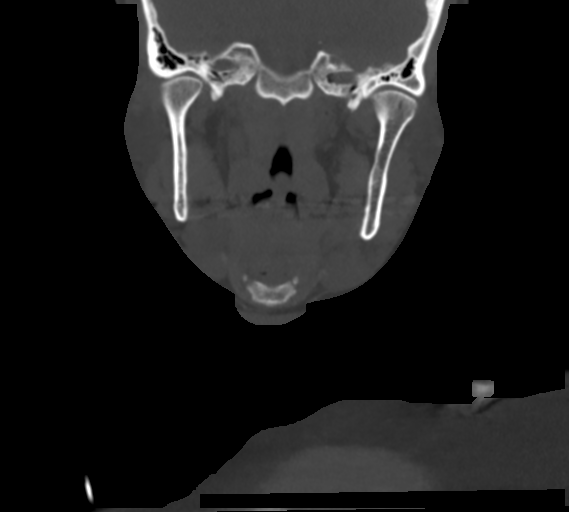
[im 75/125  bone]
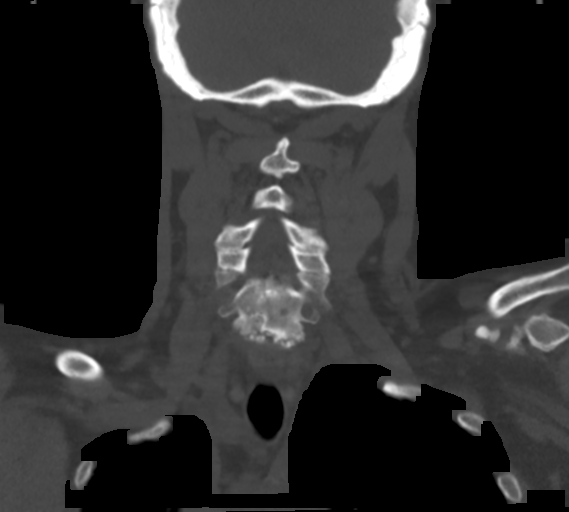

[Series 8: sagittal st · sagittal · 0.44mm/px · 5 of 101 slices shown, 6 images]
[im 34/101  bone]
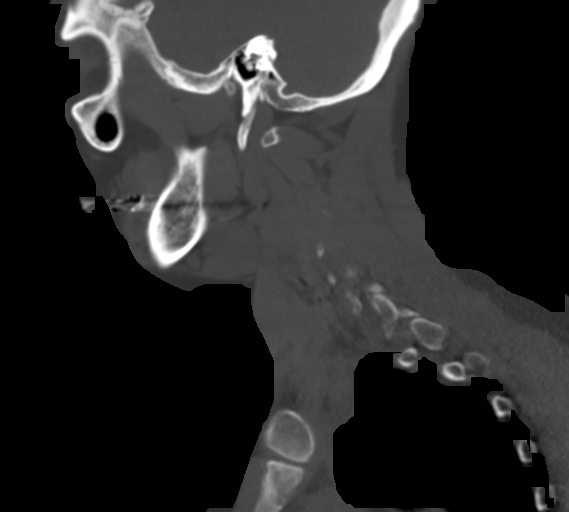
[im 42/101  bone]
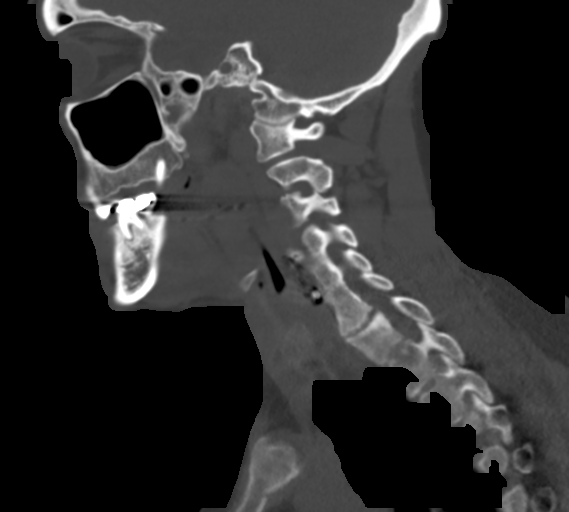
[im 51/101  soft-tissue]
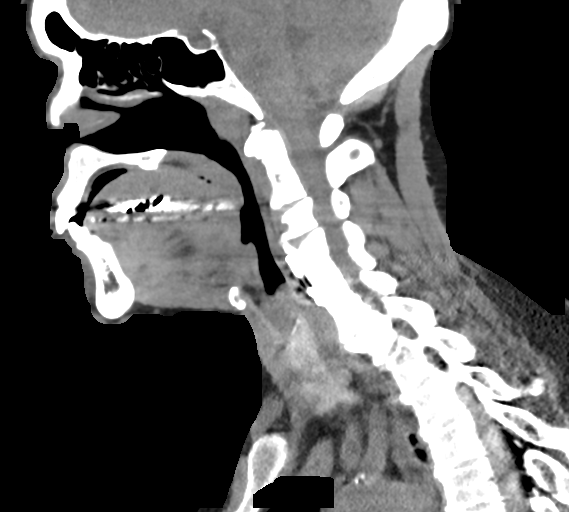
[im 51/101  bone]
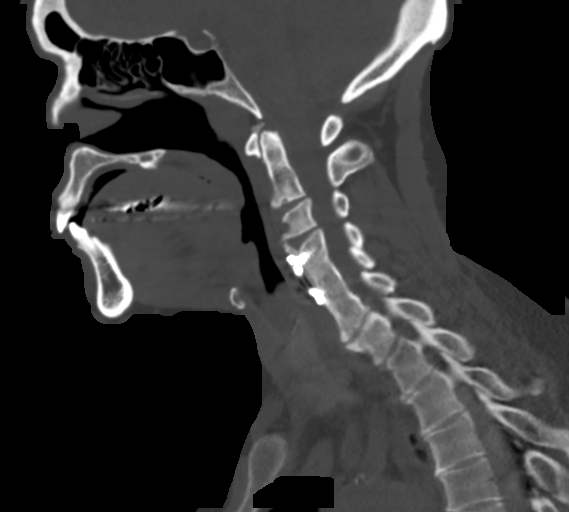
[im 59/101  bone]
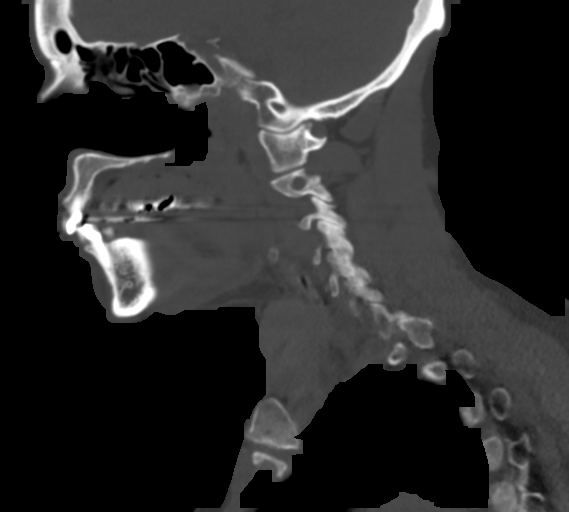
[im 67/101  bone]
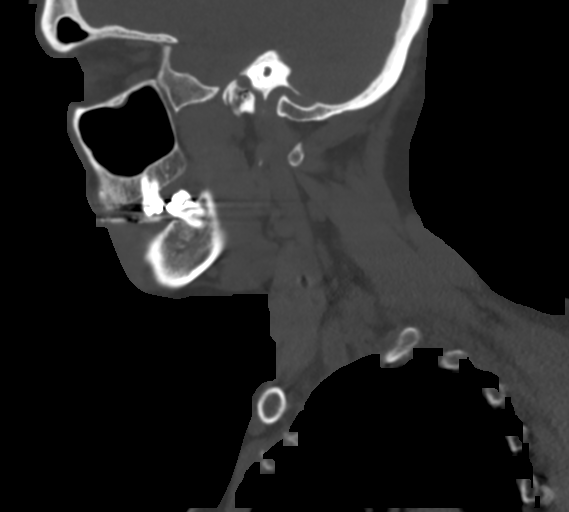

[14 of 33 positions shown; findings below may reference images not displayed]

FINDINGS: Pharynx and larynx: Unremarkable.  No mass or swelling.

Salivary glands: Unremarkable.

Thyroid: Normal.

Lymph nodes: No enlarged lymph nodes.

Vascular: Mild calcified plaque at the CCA bifurcations and ICA
origins.

Limited intracranial: No acute abnormality.

Visualized orbits: Unremarkable.

Mastoids and visualized paranasal sinuses: Aerated

Skeleton: Postoperative changes anterior fusion at C4-C6. Chronic
appearing endplate irregularity posterior osteophyte formation C6-C7
causing moderate canal stenosis.

Upper chest: Emphysema.  Apical scarring.

Other: None.
IMPRESSION: Mild calcified plaque at the common carotid bifurcations and
internal carotid origins.

## 2021-12-18 DIAGNOSIS — G4700 Insomnia, unspecified: Secondary | ICD-10-CM | POA: Diagnosis not present

## 2021-12-18 DIAGNOSIS — E785 Hyperlipidemia, unspecified: Secondary | ICD-10-CM | POA: Diagnosis not present

## 2021-12-18 DIAGNOSIS — I10 Essential (primary) hypertension: Secondary | ICD-10-CM | POA: Diagnosis not present

## 2021-12-18 DIAGNOSIS — E119 Type 2 diabetes mellitus without complications: Secondary | ICD-10-CM | POA: Diagnosis not present

## 2021-12-18 DIAGNOSIS — Z719 Counseling, unspecified: Secondary | ICD-10-CM | POA: Diagnosis not present

## 2021-12-18 DIAGNOSIS — J449 Chronic obstructive pulmonary disease, unspecified: Secondary | ICD-10-CM | POA: Diagnosis not present

## 2021-12-18 DIAGNOSIS — Z720 Tobacco use: Secondary | ICD-10-CM | POA: Diagnosis not present

## 2021-12-18 DIAGNOSIS — N186 End stage renal disease: Secondary | ICD-10-CM | POA: Diagnosis not present

## 2021-12-18 DIAGNOSIS — G894 Chronic pain syndrome: Secondary | ICD-10-CM | POA: Diagnosis not present

## 2021-12-30 DIAGNOSIS — E1121 Type 2 diabetes mellitus with diabetic nephropathy: Secondary | ICD-10-CM | POA: Diagnosis not present

## 2021-12-30 DIAGNOSIS — N2581 Secondary hyperparathyroidism of renal origin: Secondary | ICD-10-CM | POA: Diagnosis not present

## 2021-12-30 DIAGNOSIS — N185 Chronic kidney disease, stage 5: Secondary | ICD-10-CM | POA: Diagnosis not present

## 2021-12-30 DIAGNOSIS — N179 Acute kidney failure, unspecified: Secondary | ICD-10-CM | POA: Diagnosis not present

## 2021-12-30 DIAGNOSIS — E875 Hyperkalemia: Secondary | ICD-10-CM | POA: Diagnosis not present

## 2021-12-30 DIAGNOSIS — I120 Hypertensive chronic kidney disease with stage 5 chronic kidney disease or end stage renal disease: Secondary | ICD-10-CM | POA: Diagnosis not present

## 2021-12-30 DIAGNOSIS — J449 Chronic obstructive pulmonary disease, unspecified: Secondary | ICD-10-CM | POA: Diagnosis not present

## 2021-12-30 DIAGNOSIS — R809 Proteinuria, unspecified: Secondary | ICD-10-CM | POA: Diagnosis not present

## 2021-12-30 DIAGNOSIS — N261 Atrophy of kidney (terminal): Secondary | ICD-10-CM | POA: Diagnosis not present

## 2021-12-30 DIAGNOSIS — Z992 Dependence on renal dialysis: Secondary | ICD-10-CM | POA: Diagnosis not present

## 2022-01-03 DIAGNOSIS — N185 Chronic kidney disease, stage 5: Secondary | ICD-10-CM | POA: Diagnosis not present

## 2022-01-15 DIAGNOSIS — N186 End stage renal disease: Secondary | ICD-10-CM | POA: Diagnosis not present

## 2022-01-15 DIAGNOSIS — Z992 Dependence on renal dialysis: Secondary | ICD-10-CM | POA: Diagnosis not present

## 2022-01-16 DIAGNOSIS — M25551 Pain in right hip: Secondary | ICD-10-CM | POA: Diagnosis not present

## 2022-01-16 DIAGNOSIS — M9973 Connective tissue and disc stenosis of intervertebral foramina of lumbar region: Secondary | ICD-10-CM | POA: Diagnosis not present

## 2022-01-16 DIAGNOSIS — Z79891 Long term (current) use of opiate analgesic: Secondary | ICD-10-CM | POA: Diagnosis not present

## 2022-01-16 DIAGNOSIS — G8929 Other chronic pain: Secondary | ICD-10-CM | POA: Diagnosis not present

## 2022-01-16 DIAGNOSIS — M47816 Spondylosis without myelopathy or radiculopathy, lumbar region: Secondary | ICD-10-CM | POA: Diagnosis not present

## 2022-01-16 DIAGNOSIS — Z9889 Other specified postprocedural states: Secondary | ICD-10-CM | POA: Diagnosis not present

## 2022-01-16 DIAGNOSIS — M25552 Pain in left hip: Secondary | ICD-10-CM | POA: Diagnosis not present

## 2022-01-20 DIAGNOSIS — M25552 Pain in left hip: Secondary | ICD-10-CM | POA: Diagnosis not present

## 2022-01-20 DIAGNOSIS — M25551 Pain in right hip: Secondary | ICD-10-CM | POA: Diagnosis not present

## 2022-01-24 DIAGNOSIS — N186 End stage renal disease: Secondary | ICD-10-CM | POA: Diagnosis not present

## 2022-01-24 DIAGNOSIS — N2581 Secondary hyperparathyroidism of renal origin: Secondary | ICD-10-CM | POA: Diagnosis not present

## 2022-01-24 DIAGNOSIS — Z992 Dependence on renal dialysis: Secondary | ICD-10-CM | POA: Diagnosis not present

## 2022-01-27 DIAGNOSIS — Z794 Long term (current) use of insulin: Secondary | ICD-10-CM | POA: Diagnosis not present

## 2022-01-27 DIAGNOSIS — E1122 Type 2 diabetes mellitus with diabetic chronic kidney disease: Secondary | ICD-10-CM | POA: Diagnosis not present

## 2022-01-27 DIAGNOSIS — E785 Hyperlipidemia, unspecified: Secondary | ICD-10-CM | POA: Diagnosis not present

## 2022-01-27 DIAGNOSIS — Z992 Dependence on renal dialysis: Secondary | ICD-10-CM | POA: Diagnosis not present

## 2022-01-27 DIAGNOSIS — N2581 Secondary hyperparathyroidism of renal origin: Secondary | ICD-10-CM | POA: Diagnosis not present

## 2022-01-27 DIAGNOSIS — N186 End stage renal disease: Secondary | ICD-10-CM | POA: Diagnosis not present

## 2022-01-29 DIAGNOSIS — N186 End stage renal disease: Secondary | ICD-10-CM | POA: Diagnosis not present

## 2022-01-29 DIAGNOSIS — Z992 Dependence on renal dialysis: Secondary | ICD-10-CM | POA: Diagnosis not present

## 2022-01-29 DIAGNOSIS — N2581 Secondary hyperparathyroidism of renal origin: Secondary | ICD-10-CM | POA: Diagnosis not present

## 2022-01-31 DIAGNOSIS — N186 End stage renal disease: Secondary | ICD-10-CM | POA: Diagnosis not present

## 2022-01-31 DIAGNOSIS — Z992 Dependence on renal dialysis: Secondary | ICD-10-CM | POA: Diagnosis not present

## 2022-01-31 DIAGNOSIS — N2581 Secondary hyperparathyroidism of renal origin: Secondary | ICD-10-CM | POA: Diagnosis not present

## 2022-02-03 DIAGNOSIS — N2581 Secondary hyperparathyroidism of renal origin: Secondary | ICD-10-CM | POA: Diagnosis not present

## 2022-02-03 DIAGNOSIS — N186 End stage renal disease: Secondary | ICD-10-CM | POA: Diagnosis not present

## 2022-02-03 DIAGNOSIS — Z992 Dependence on renal dialysis: Secondary | ICD-10-CM | POA: Diagnosis not present

## 2022-02-05 DIAGNOSIS — N186 End stage renal disease: Secondary | ICD-10-CM | POA: Diagnosis not present

## 2022-02-05 DIAGNOSIS — N2581 Secondary hyperparathyroidism of renal origin: Secondary | ICD-10-CM | POA: Diagnosis not present

## 2022-02-05 DIAGNOSIS — Z992 Dependence on renal dialysis: Secondary | ICD-10-CM | POA: Diagnosis not present

## 2022-02-07 DIAGNOSIS — N2581 Secondary hyperparathyroidism of renal origin: Secondary | ICD-10-CM | POA: Diagnosis not present

## 2022-02-07 DIAGNOSIS — Z992 Dependence on renal dialysis: Secondary | ICD-10-CM | POA: Diagnosis not present

## 2022-02-07 DIAGNOSIS — N186 End stage renal disease: Secondary | ICD-10-CM | POA: Diagnosis not present

## 2022-02-10 DIAGNOSIS — Z992 Dependence on renal dialysis: Secondary | ICD-10-CM | POA: Diagnosis not present

## 2022-02-10 DIAGNOSIS — N186 End stage renal disease: Secondary | ICD-10-CM | POA: Diagnosis not present

## 2022-02-10 DIAGNOSIS — N2581 Secondary hyperparathyroidism of renal origin: Secondary | ICD-10-CM | POA: Diagnosis not present

## 2022-02-12 DIAGNOSIS — N186 End stage renal disease: Secondary | ICD-10-CM | POA: Diagnosis not present

## 2022-02-12 DIAGNOSIS — N2581 Secondary hyperparathyroidism of renal origin: Secondary | ICD-10-CM | POA: Diagnosis not present

## 2022-02-12 DIAGNOSIS — Z992 Dependence on renal dialysis: Secondary | ICD-10-CM | POA: Diagnosis not present

## 2022-02-14 DIAGNOSIS — Z992 Dependence on renal dialysis: Secondary | ICD-10-CM | POA: Diagnosis not present

## 2022-02-14 DIAGNOSIS — N186 End stage renal disease: Secondary | ICD-10-CM | POA: Diagnosis not present

## 2022-02-14 DIAGNOSIS — N2581 Secondary hyperparathyroidism of renal origin: Secondary | ICD-10-CM | POA: Diagnosis not present

## 2022-02-17 DIAGNOSIS — N186 End stage renal disease: Secondary | ICD-10-CM | POA: Diagnosis not present

## 2022-02-17 DIAGNOSIS — N2581 Secondary hyperparathyroidism of renal origin: Secondary | ICD-10-CM | POA: Diagnosis not present

## 2022-02-17 DIAGNOSIS — Z992 Dependence on renal dialysis: Secondary | ICD-10-CM | POA: Diagnosis not present

## 2022-02-19 DIAGNOSIS — N186 End stage renal disease: Secondary | ICD-10-CM | POA: Diagnosis not present

## 2022-02-19 DIAGNOSIS — N2581 Secondary hyperparathyroidism of renal origin: Secondary | ICD-10-CM | POA: Diagnosis not present

## 2022-02-19 DIAGNOSIS — Z992 Dependence on renal dialysis: Secondary | ICD-10-CM | POA: Diagnosis not present

## 2022-02-20 DIAGNOSIS — M47816 Spondylosis without myelopathy or radiculopathy, lumbar region: Secondary | ICD-10-CM | POA: Diagnosis not present

## 2022-02-20 DIAGNOSIS — M961 Postlaminectomy syndrome, not elsewhere classified: Secondary | ICD-10-CM | POA: Diagnosis not present

## 2022-02-20 DIAGNOSIS — E119 Type 2 diabetes mellitus without complications: Secondary | ICD-10-CM | POA: Diagnosis not present

## 2022-02-20 DIAGNOSIS — G894 Chronic pain syndrome: Secondary | ICD-10-CM | POA: Diagnosis not present

## 2022-02-20 DIAGNOSIS — Z79891 Long term (current) use of opiate analgesic: Secondary | ICD-10-CM | POA: Diagnosis not present

## 2022-02-20 DIAGNOSIS — M533 Sacrococcygeal disorders, not elsewhere classified: Secondary | ICD-10-CM | POA: Diagnosis not present

## 2022-02-20 DIAGNOSIS — Z96652 Presence of left artificial knee joint: Secondary | ICD-10-CM | POA: Diagnosis not present

## 2022-02-20 DIAGNOSIS — M25562 Pain in left knee: Secondary | ICD-10-CM | POA: Diagnosis not present

## 2022-02-20 DIAGNOSIS — M461 Sacroiliitis, not elsewhere classified: Secondary | ICD-10-CM | POA: Diagnosis not present

## 2022-02-20 DIAGNOSIS — M160 Bilateral primary osteoarthritis of hip: Secondary | ICD-10-CM | POA: Diagnosis not present

## 2022-02-21 DIAGNOSIS — N2581 Secondary hyperparathyroidism of renal origin: Secondary | ICD-10-CM | POA: Diagnosis not present

## 2022-02-21 DIAGNOSIS — N186 End stage renal disease: Secondary | ICD-10-CM | POA: Diagnosis not present

## 2022-02-21 DIAGNOSIS — Z992 Dependence on renal dialysis: Secondary | ICD-10-CM | POA: Diagnosis not present

## 2022-02-24 DIAGNOSIS — Z992 Dependence on renal dialysis: Secondary | ICD-10-CM | POA: Diagnosis not present

## 2022-02-24 DIAGNOSIS — N2581 Secondary hyperparathyroidism of renal origin: Secondary | ICD-10-CM | POA: Diagnosis not present

## 2022-02-24 DIAGNOSIS — N186 End stage renal disease: Secondary | ICD-10-CM | POA: Diagnosis not present

## 2022-02-26 DIAGNOSIS — N2581 Secondary hyperparathyroidism of renal origin: Secondary | ICD-10-CM | POA: Diagnosis not present

## 2022-02-26 DIAGNOSIS — Z992 Dependence on renal dialysis: Secondary | ICD-10-CM | POA: Diagnosis not present

## 2022-02-26 DIAGNOSIS — Z794 Long term (current) use of insulin: Secondary | ICD-10-CM | POA: Diagnosis not present

## 2022-02-26 DIAGNOSIS — E119 Type 2 diabetes mellitus without complications: Secondary | ICD-10-CM | POA: Diagnosis not present

## 2022-02-26 DIAGNOSIS — N186 End stage renal disease: Secondary | ICD-10-CM | POA: Diagnosis not present

## 2022-02-28 DIAGNOSIS — N186 End stage renal disease: Secondary | ICD-10-CM | POA: Diagnosis not present

## 2022-02-28 DIAGNOSIS — N2581 Secondary hyperparathyroidism of renal origin: Secondary | ICD-10-CM | POA: Diagnosis not present

## 2022-02-28 DIAGNOSIS — Z992 Dependence on renal dialysis: Secondary | ICD-10-CM | POA: Diagnosis not present

## 2022-03-05 DIAGNOSIS — N2581 Secondary hyperparathyroidism of renal origin: Secondary | ICD-10-CM | POA: Diagnosis not present

## 2022-03-05 DIAGNOSIS — Z992 Dependence on renal dialysis: Secondary | ICD-10-CM | POA: Diagnosis not present

## 2022-03-05 DIAGNOSIS — N186 End stage renal disease: Secondary | ICD-10-CM | POA: Diagnosis not present

## 2022-03-07 DIAGNOSIS — Z992 Dependence on renal dialysis: Secondary | ICD-10-CM | POA: Diagnosis not present

## 2022-03-07 DIAGNOSIS — N2581 Secondary hyperparathyroidism of renal origin: Secondary | ICD-10-CM | POA: Diagnosis not present

## 2022-03-07 DIAGNOSIS — N186 End stage renal disease: Secondary | ICD-10-CM | POA: Diagnosis not present

## 2022-03-09 DIAGNOSIS — N2581 Secondary hyperparathyroidism of renal origin: Secondary | ICD-10-CM | POA: Diagnosis not present

## 2022-03-09 DIAGNOSIS — N186 End stage renal disease: Secondary | ICD-10-CM | POA: Diagnosis not present

## 2022-03-09 DIAGNOSIS — Z992 Dependence on renal dialysis: Secondary | ICD-10-CM | POA: Diagnosis not present

## 2022-03-12 DIAGNOSIS — Z992 Dependence on renal dialysis: Secondary | ICD-10-CM | POA: Diagnosis not present

## 2022-03-12 DIAGNOSIS — N2581 Secondary hyperparathyroidism of renal origin: Secondary | ICD-10-CM | POA: Diagnosis not present

## 2022-03-12 DIAGNOSIS — N186 End stage renal disease: Secondary | ICD-10-CM | POA: Diagnosis not present

## 2022-03-14 DIAGNOSIS — Z992 Dependence on renal dialysis: Secondary | ICD-10-CM | POA: Diagnosis not present

## 2022-03-14 DIAGNOSIS — N2581 Secondary hyperparathyroidism of renal origin: Secondary | ICD-10-CM | POA: Diagnosis not present

## 2022-03-14 DIAGNOSIS — N186 End stage renal disease: Secondary | ICD-10-CM | POA: Diagnosis not present

## 2022-03-17 DIAGNOSIS — N186 End stage renal disease: Secondary | ICD-10-CM | POA: Diagnosis not present

## 2022-03-17 DIAGNOSIS — Z992 Dependence on renal dialysis: Secondary | ICD-10-CM | POA: Diagnosis not present

## 2022-03-17 DIAGNOSIS — N2581 Secondary hyperparathyroidism of renal origin: Secondary | ICD-10-CM | POA: Diagnosis not present

## 2022-03-17 DIAGNOSIS — E1121 Type 2 diabetes mellitus with diabetic nephropathy: Secondary | ICD-10-CM | POA: Diagnosis not present

## 2022-03-19 DIAGNOSIS — Z992 Dependence on renal dialysis: Secondary | ICD-10-CM | POA: Diagnosis not present

## 2022-03-19 DIAGNOSIS — N2581 Secondary hyperparathyroidism of renal origin: Secondary | ICD-10-CM | POA: Diagnosis not present

## 2022-03-19 DIAGNOSIS — N186 End stage renal disease: Secondary | ICD-10-CM | POA: Diagnosis not present

## 2022-03-21 DIAGNOSIS — N2581 Secondary hyperparathyroidism of renal origin: Secondary | ICD-10-CM | POA: Diagnosis not present

## 2022-03-21 DIAGNOSIS — N186 End stage renal disease: Secondary | ICD-10-CM | POA: Diagnosis not present

## 2022-03-21 DIAGNOSIS — Z992 Dependence on renal dialysis: Secondary | ICD-10-CM | POA: Diagnosis not present

## 2022-03-24 DIAGNOSIS — N186 End stage renal disease: Secondary | ICD-10-CM | POA: Diagnosis not present

## 2022-03-24 DIAGNOSIS — Z992 Dependence on renal dialysis: Secondary | ICD-10-CM | POA: Diagnosis not present

## 2022-03-24 DIAGNOSIS — N2581 Secondary hyperparathyroidism of renal origin: Secondary | ICD-10-CM | POA: Diagnosis not present

## 2022-03-26 DIAGNOSIS — N2581 Secondary hyperparathyroidism of renal origin: Secondary | ICD-10-CM | POA: Diagnosis not present

## 2022-03-26 DIAGNOSIS — N186 End stage renal disease: Secondary | ICD-10-CM | POA: Diagnosis not present

## 2022-03-26 DIAGNOSIS — Z992 Dependence on renal dialysis: Secondary | ICD-10-CM | POA: Diagnosis not present

## 2022-03-27 DIAGNOSIS — I10 Essential (primary) hypertension: Secondary | ICD-10-CM | POA: Diagnosis not present

## 2022-03-27 DIAGNOSIS — J209 Acute bronchitis, unspecified: Secondary | ICD-10-CM | POA: Diagnosis not present

## 2022-03-27 DIAGNOSIS — G4700 Insomnia, unspecified: Secondary | ICD-10-CM | POA: Diagnosis not present

## 2022-03-27 DIAGNOSIS — J440 Chronic obstructive pulmonary disease with acute lower respiratory infection: Secondary | ICD-10-CM | POA: Diagnosis not present

## 2022-03-27 DIAGNOSIS — E119 Type 2 diabetes mellitus without complications: Secondary | ICD-10-CM | POA: Diagnosis not present

## 2022-03-27 DIAGNOSIS — Z720 Tobacco use: Secondary | ICD-10-CM | POA: Diagnosis not present

## 2022-03-27 DIAGNOSIS — Z719 Counseling, unspecified: Secondary | ICD-10-CM | POA: Diagnosis not present

## 2022-03-27 DIAGNOSIS — R062 Wheezing: Secondary | ICD-10-CM | POA: Diagnosis not present

## 2022-03-27 DIAGNOSIS — R0981 Nasal congestion: Secondary | ICD-10-CM | POA: Diagnosis not present

## 2022-03-31 DIAGNOSIS — N186 End stage renal disease: Secondary | ICD-10-CM | POA: Diagnosis not present

## 2022-03-31 DIAGNOSIS — Z992 Dependence on renal dialysis: Secondary | ICD-10-CM | POA: Diagnosis not present

## 2022-03-31 DIAGNOSIS — N2581 Secondary hyperparathyroidism of renal origin: Secondary | ICD-10-CM | POA: Diagnosis not present

## 2022-04-02 DIAGNOSIS — N186 End stage renal disease: Secondary | ICD-10-CM | POA: Diagnosis not present

## 2022-04-02 DIAGNOSIS — N2581 Secondary hyperparathyroidism of renal origin: Secondary | ICD-10-CM | POA: Diagnosis not present

## 2022-04-02 DIAGNOSIS — Z992 Dependence on renal dialysis: Secondary | ICD-10-CM | POA: Diagnosis not present

## 2022-04-04 DIAGNOSIS — N2581 Secondary hyperparathyroidism of renal origin: Secondary | ICD-10-CM | POA: Diagnosis not present

## 2022-04-04 DIAGNOSIS — N186 End stage renal disease: Secondary | ICD-10-CM | POA: Diagnosis not present

## 2022-04-04 DIAGNOSIS — Z992 Dependence on renal dialysis: Secondary | ICD-10-CM | POA: Diagnosis not present

## 2022-04-07 DIAGNOSIS — N186 End stage renal disease: Secondary | ICD-10-CM | POA: Diagnosis not present

## 2022-04-07 DIAGNOSIS — N2581 Secondary hyperparathyroidism of renal origin: Secondary | ICD-10-CM | POA: Diagnosis not present

## 2022-04-07 DIAGNOSIS — Z992 Dependence on renal dialysis: Secondary | ICD-10-CM | POA: Diagnosis not present

## 2022-04-09 DIAGNOSIS — N186 End stage renal disease: Secondary | ICD-10-CM | POA: Diagnosis not present

## 2022-04-09 DIAGNOSIS — N2581 Secondary hyperparathyroidism of renal origin: Secondary | ICD-10-CM | POA: Diagnosis not present

## 2022-04-09 DIAGNOSIS — Z992 Dependence on renal dialysis: Secondary | ICD-10-CM | POA: Diagnosis not present

## 2022-04-11 DIAGNOSIS — N186 End stage renal disease: Secondary | ICD-10-CM | POA: Diagnosis not present

## 2022-04-11 DIAGNOSIS — Z992 Dependence on renal dialysis: Secondary | ICD-10-CM | POA: Diagnosis not present

## 2022-04-11 DIAGNOSIS — N2581 Secondary hyperparathyroidism of renal origin: Secondary | ICD-10-CM | POA: Diagnosis not present

## 2022-04-14 DIAGNOSIS — Z992 Dependence on renal dialysis: Secondary | ICD-10-CM | POA: Diagnosis not present

## 2022-04-14 DIAGNOSIS — N186 End stage renal disease: Secondary | ICD-10-CM | POA: Diagnosis not present

## 2022-04-14 DIAGNOSIS — N2581 Secondary hyperparathyroidism of renal origin: Secondary | ICD-10-CM | POA: Diagnosis not present

## 2022-04-17 DIAGNOSIS — E1121 Type 2 diabetes mellitus with diabetic nephropathy: Secondary | ICD-10-CM | POA: Diagnosis not present

## 2022-04-17 DIAGNOSIS — Z992 Dependence on renal dialysis: Secondary | ICD-10-CM | POA: Diagnosis not present

## 2022-04-17 DIAGNOSIS — N186 End stage renal disease: Secondary | ICD-10-CM | POA: Diagnosis not present

## 2022-04-18 DIAGNOSIS — N2581 Secondary hyperparathyroidism of renal origin: Secondary | ICD-10-CM | POA: Diagnosis not present

## 2022-04-18 DIAGNOSIS — Z992 Dependence on renal dialysis: Secondary | ICD-10-CM | POA: Diagnosis not present

## 2022-04-18 DIAGNOSIS — N186 End stage renal disease: Secondary | ICD-10-CM | POA: Diagnosis not present

## 2022-04-21 DIAGNOSIS — R42 Dizziness and giddiness: Secondary | ICD-10-CM | POA: Diagnosis not present

## 2022-04-21 DIAGNOSIS — Z992 Dependence on renal dialysis: Secondary | ICD-10-CM | POA: Diagnosis not present

## 2022-04-21 DIAGNOSIS — K6389 Other specified diseases of intestine: Secondary | ICD-10-CM | POA: Diagnosis not present

## 2022-04-21 DIAGNOSIS — K828 Other specified diseases of gallbladder: Secondary | ICD-10-CM | POA: Diagnosis not present

## 2022-04-21 DIAGNOSIS — R000 Tachycardia, unspecified: Secondary | ICD-10-CM | POA: Diagnosis not present

## 2022-04-21 DIAGNOSIS — K625 Hemorrhage of anus and rectum: Secondary | ICD-10-CM | POA: Diagnosis not present

## 2022-04-23 DIAGNOSIS — Z992 Dependence on renal dialysis: Secondary | ICD-10-CM | POA: Diagnosis not present

## 2022-04-23 DIAGNOSIS — N2581 Secondary hyperparathyroidism of renal origin: Secondary | ICD-10-CM | POA: Diagnosis not present

## 2022-04-23 DIAGNOSIS — N186 End stage renal disease: Secondary | ICD-10-CM | POA: Diagnosis not present

## 2022-04-24 DIAGNOSIS — Z719 Counseling, unspecified: Secondary | ICD-10-CM | POA: Diagnosis not present

## 2022-04-24 DIAGNOSIS — E119 Type 2 diabetes mellitus without complications: Secondary | ICD-10-CM | POA: Diagnosis not present

## 2022-04-24 DIAGNOSIS — Z720 Tobacco use: Secondary | ICD-10-CM | POA: Diagnosis not present

## 2022-04-24 DIAGNOSIS — N186 End stage renal disease: Secondary | ICD-10-CM | POA: Diagnosis not present

## 2022-04-24 DIAGNOSIS — K625 Hemorrhage of anus and rectum: Secondary | ICD-10-CM | POA: Diagnosis not present

## 2022-04-24 DIAGNOSIS — I10 Essential (primary) hypertension: Secondary | ICD-10-CM | POA: Diagnosis not present

## 2022-04-24 DIAGNOSIS — J449 Chronic obstructive pulmonary disease, unspecified: Secondary | ICD-10-CM | POA: Diagnosis not present

## 2022-04-24 DIAGNOSIS — G894 Chronic pain syndrome: Secondary | ICD-10-CM | POA: Diagnosis not present

## 2022-04-25 DIAGNOSIS — Z992 Dependence on renal dialysis: Secondary | ICD-10-CM | POA: Diagnosis not present

## 2022-04-25 DIAGNOSIS — N186 End stage renal disease: Secondary | ICD-10-CM | POA: Diagnosis not present

## 2022-04-25 DIAGNOSIS — N2581 Secondary hyperparathyroidism of renal origin: Secondary | ICD-10-CM | POA: Diagnosis not present

## 2022-04-28 DIAGNOSIS — N2581 Secondary hyperparathyroidism of renal origin: Secondary | ICD-10-CM | POA: Diagnosis not present

## 2022-04-28 DIAGNOSIS — N186 End stage renal disease: Secondary | ICD-10-CM | POA: Diagnosis not present

## 2022-04-28 DIAGNOSIS — Z992 Dependence on renal dialysis: Secondary | ICD-10-CM | POA: Diagnosis not present

## 2022-04-30 DIAGNOSIS — N186 End stage renal disease: Secondary | ICD-10-CM | POA: Diagnosis not present

## 2022-04-30 DIAGNOSIS — Z992 Dependence on renal dialysis: Secondary | ICD-10-CM | POA: Diagnosis not present

## 2022-04-30 DIAGNOSIS — N2581 Secondary hyperparathyroidism of renal origin: Secondary | ICD-10-CM | POA: Diagnosis not present

## 2022-05-02 DIAGNOSIS — N2581 Secondary hyperparathyroidism of renal origin: Secondary | ICD-10-CM | POA: Diagnosis not present

## 2022-05-02 DIAGNOSIS — N186 End stage renal disease: Secondary | ICD-10-CM | POA: Diagnosis not present

## 2022-05-02 DIAGNOSIS — Z992 Dependence on renal dialysis: Secondary | ICD-10-CM | POA: Diagnosis not present

## 2022-05-07 DIAGNOSIS — N2581 Secondary hyperparathyroidism of renal origin: Secondary | ICD-10-CM | POA: Diagnosis not present

## 2022-05-07 DIAGNOSIS — N186 End stage renal disease: Secondary | ICD-10-CM | POA: Diagnosis not present

## 2022-05-07 DIAGNOSIS — Z992 Dependence on renal dialysis: Secondary | ICD-10-CM | POA: Diagnosis not present

## 2022-05-09 DIAGNOSIS — N2581 Secondary hyperparathyroidism of renal origin: Secondary | ICD-10-CM | POA: Diagnosis not present

## 2022-05-09 DIAGNOSIS — N186 End stage renal disease: Secondary | ICD-10-CM | POA: Diagnosis not present

## 2022-05-09 DIAGNOSIS — Z992 Dependence on renal dialysis: Secondary | ICD-10-CM | POA: Diagnosis not present

## 2022-05-12 DIAGNOSIS — Z992 Dependence on renal dialysis: Secondary | ICD-10-CM | POA: Diagnosis not present

## 2022-05-12 DIAGNOSIS — N186 End stage renal disease: Secondary | ICD-10-CM | POA: Diagnosis not present

## 2022-05-12 DIAGNOSIS — N2581 Secondary hyperparathyroidism of renal origin: Secondary | ICD-10-CM | POA: Diagnosis not present

## 2022-05-14 DIAGNOSIS — N186 End stage renal disease: Secondary | ICD-10-CM | POA: Diagnosis not present

## 2022-05-14 DIAGNOSIS — Z992 Dependence on renal dialysis: Secondary | ICD-10-CM | POA: Diagnosis not present

## 2022-05-14 DIAGNOSIS — N2581 Secondary hyperparathyroidism of renal origin: Secondary | ICD-10-CM | POA: Diagnosis not present

## 2022-05-16 DIAGNOSIS — N186 End stage renal disease: Secondary | ICD-10-CM | POA: Diagnosis not present

## 2022-05-16 DIAGNOSIS — N2581 Secondary hyperparathyroidism of renal origin: Secondary | ICD-10-CM | POA: Diagnosis not present

## 2022-05-16 DIAGNOSIS — Z992 Dependence on renal dialysis: Secondary | ICD-10-CM | POA: Diagnosis not present

## 2022-05-16 DIAGNOSIS — E1121 Type 2 diabetes mellitus with diabetic nephropathy: Secondary | ICD-10-CM | POA: Diagnosis not present

## 2022-05-19 DIAGNOSIS — N186 End stage renal disease: Secondary | ICD-10-CM | POA: Diagnosis not present

## 2022-05-19 DIAGNOSIS — N2581 Secondary hyperparathyroidism of renal origin: Secondary | ICD-10-CM | POA: Diagnosis not present

## 2022-05-19 DIAGNOSIS — Z992 Dependence on renal dialysis: Secondary | ICD-10-CM | POA: Diagnosis not present

## 2022-05-21 DIAGNOSIS — N186 End stage renal disease: Secondary | ICD-10-CM | POA: Diagnosis not present

## 2022-05-21 DIAGNOSIS — N2581 Secondary hyperparathyroidism of renal origin: Secondary | ICD-10-CM | POA: Diagnosis not present

## 2022-05-21 DIAGNOSIS — Z992 Dependence on renal dialysis: Secondary | ICD-10-CM | POA: Diagnosis not present

## 2022-05-23 DIAGNOSIS — Z992 Dependence on renal dialysis: Secondary | ICD-10-CM | POA: Diagnosis not present

## 2022-05-23 DIAGNOSIS — N2581 Secondary hyperparathyroidism of renal origin: Secondary | ICD-10-CM | POA: Diagnosis not present

## 2022-05-23 DIAGNOSIS — N186 End stage renal disease: Secondary | ICD-10-CM | POA: Diagnosis not present

## 2022-05-26 DIAGNOSIS — Z992 Dependence on renal dialysis: Secondary | ICD-10-CM | POA: Diagnosis not present

## 2022-05-26 DIAGNOSIS — E1122 Type 2 diabetes mellitus with diabetic chronic kidney disease: Secondary | ICD-10-CM | POA: Diagnosis not present

## 2022-05-26 DIAGNOSIS — N2581 Secondary hyperparathyroidism of renal origin: Secondary | ICD-10-CM | POA: Diagnosis not present

## 2022-05-26 DIAGNOSIS — E785 Hyperlipidemia, unspecified: Secondary | ICD-10-CM | POA: Diagnosis not present

## 2022-05-26 DIAGNOSIS — Z794 Long term (current) use of insulin: Secondary | ICD-10-CM | POA: Diagnosis not present

## 2022-05-26 DIAGNOSIS — N186 End stage renal disease: Secondary | ICD-10-CM | POA: Diagnosis not present

## 2022-05-28 DIAGNOSIS — N186 End stage renal disease: Secondary | ICD-10-CM | POA: Diagnosis not present

## 2022-05-28 DIAGNOSIS — Z992 Dependence on renal dialysis: Secondary | ICD-10-CM | POA: Diagnosis not present

## 2022-05-28 DIAGNOSIS — N2581 Secondary hyperparathyroidism of renal origin: Secondary | ICD-10-CM | POA: Diagnosis not present

## 2022-05-30 DIAGNOSIS — N2581 Secondary hyperparathyroidism of renal origin: Secondary | ICD-10-CM | POA: Diagnosis not present

## 2022-05-30 DIAGNOSIS — N186 End stage renal disease: Secondary | ICD-10-CM | POA: Diagnosis not present

## 2022-05-30 DIAGNOSIS — Z992 Dependence on renal dialysis: Secondary | ICD-10-CM | POA: Diagnosis not present

## 2022-06-02 DIAGNOSIS — N2581 Secondary hyperparathyroidism of renal origin: Secondary | ICD-10-CM | POA: Diagnosis not present

## 2022-06-02 DIAGNOSIS — Z992 Dependence on renal dialysis: Secondary | ICD-10-CM | POA: Diagnosis not present

## 2022-06-02 DIAGNOSIS — N186 End stage renal disease: Secondary | ICD-10-CM | POA: Diagnosis not present

## 2022-06-04 DIAGNOSIS — N186 End stage renal disease: Secondary | ICD-10-CM | POA: Diagnosis not present

## 2022-06-04 DIAGNOSIS — N2581 Secondary hyperparathyroidism of renal origin: Secondary | ICD-10-CM | POA: Diagnosis not present

## 2022-06-04 DIAGNOSIS — Z992 Dependence on renal dialysis: Secondary | ICD-10-CM | POA: Diagnosis not present

## 2022-06-06 DIAGNOSIS — Z992 Dependence on renal dialysis: Secondary | ICD-10-CM | POA: Diagnosis not present

## 2022-06-06 DIAGNOSIS — N186 End stage renal disease: Secondary | ICD-10-CM | POA: Diagnosis not present

## 2022-06-06 DIAGNOSIS — N2581 Secondary hyperparathyroidism of renal origin: Secondary | ICD-10-CM | POA: Diagnosis not present

## 2022-06-09 DIAGNOSIS — Z992 Dependence on renal dialysis: Secondary | ICD-10-CM | POA: Diagnosis not present

## 2022-06-09 DIAGNOSIS — N2581 Secondary hyperparathyroidism of renal origin: Secondary | ICD-10-CM | POA: Diagnosis not present

## 2022-06-09 DIAGNOSIS — N186 End stage renal disease: Secondary | ICD-10-CM | POA: Diagnosis not present

## 2022-06-11 DIAGNOSIS — N186 End stage renal disease: Secondary | ICD-10-CM | POA: Diagnosis not present

## 2022-06-11 DIAGNOSIS — N2581 Secondary hyperparathyroidism of renal origin: Secondary | ICD-10-CM | POA: Diagnosis not present

## 2022-06-11 DIAGNOSIS — Z992 Dependence on renal dialysis: Secondary | ICD-10-CM | POA: Diagnosis not present

## 2022-06-13 DIAGNOSIS — N186 End stage renal disease: Secondary | ICD-10-CM | POA: Diagnosis not present

## 2022-06-13 DIAGNOSIS — N2581 Secondary hyperparathyroidism of renal origin: Secondary | ICD-10-CM | POA: Diagnosis not present

## 2022-06-13 DIAGNOSIS — Z992 Dependence on renal dialysis: Secondary | ICD-10-CM | POA: Diagnosis not present

## 2022-06-16 DIAGNOSIS — E1121 Type 2 diabetes mellitus with diabetic nephropathy: Secondary | ICD-10-CM | POA: Diagnosis not present

## 2022-06-16 DIAGNOSIS — N186 End stage renal disease: Secondary | ICD-10-CM | POA: Diagnosis not present

## 2022-06-16 DIAGNOSIS — Z992 Dependence on renal dialysis: Secondary | ICD-10-CM | POA: Diagnosis not present

## 2022-06-16 DIAGNOSIS — N2581 Secondary hyperparathyroidism of renal origin: Secondary | ICD-10-CM | POA: Diagnosis not present

## 2022-06-18 DIAGNOSIS — N2581 Secondary hyperparathyroidism of renal origin: Secondary | ICD-10-CM | POA: Diagnosis not present

## 2022-06-18 DIAGNOSIS — Z992 Dependence on renal dialysis: Secondary | ICD-10-CM | POA: Diagnosis not present

## 2022-06-18 DIAGNOSIS — N186 End stage renal disease: Secondary | ICD-10-CM | POA: Diagnosis not present

## 2022-06-20 DIAGNOSIS — N186 End stage renal disease: Secondary | ICD-10-CM | POA: Diagnosis not present

## 2022-06-20 DIAGNOSIS — N2581 Secondary hyperparathyroidism of renal origin: Secondary | ICD-10-CM | POA: Diagnosis not present

## 2022-06-20 DIAGNOSIS — Z992 Dependence on renal dialysis: Secondary | ICD-10-CM | POA: Diagnosis not present

## 2022-06-23 DIAGNOSIS — D649 Anemia, unspecified: Secondary | ICD-10-CM | POA: Diagnosis not present

## 2022-06-23 DIAGNOSIS — F17210 Nicotine dependence, cigarettes, uncomplicated: Secondary | ICD-10-CM | POA: Diagnosis not present

## 2022-06-23 DIAGNOSIS — M5136 Other intervertebral disc degeneration, lumbar region: Secondary | ICD-10-CM | POA: Diagnosis not present

## 2022-06-23 DIAGNOSIS — G8929 Other chronic pain: Secondary | ICD-10-CM | POA: Diagnosis not present

## 2022-06-23 DIAGNOSIS — E119 Type 2 diabetes mellitus without complications: Secondary | ICD-10-CM | POA: Diagnosis not present

## 2022-06-23 DIAGNOSIS — M47816 Spondylosis without myelopathy or radiculopathy, lumbar region: Secondary | ICD-10-CM | POA: Diagnosis not present

## 2022-06-23 DIAGNOSIS — N186 End stage renal disease: Secondary | ICD-10-CM | POA: Diagnosis not present

## 2022-06-23 DIAGNOSIS — Z720 Tobacco use: Secondary | ICD-10-CM | POA: Diagnosis not present

## 2022-06-23 DIAGNOSIS — I10 Essential (primary) hypertension: Secondary | ICD-10-CM | POA: Diagnosis not present

## 2022-06-23 DIAGNOSIS — G4700 Insomnia, unspecified: Secondary | ICD-10-CM | POA: Diagnosis not present

## 2022-06-23 DIAGNOSIS — Z79891 Long term (current) use of opiate analgesic: Secondary | ICD-10-CM | POA: Diagnosis not present

## 2022-06-23 DIAGNOSIS — Z96652 Presence of left artificial knee joint: Secondary | ICD-10-CM | POA: Diagnosis not present

## 2022-06-23 DIAGNOSIS — M961 Postlaminectomy syndrome, not elsewhere classified: Secondary | ICD-10-CM | POA: Diagnosis not present

## 2022-06-23 DIAGNOSIS — E785 Hyperlipidemia, unspecified: Secondary | ICD-10-CM | POA: Diagnosis not present

## 2022-06-23 DIAGNOSIS — Z719 Counseling, unspecified: Secondary | ICD-10-CM | POA: Diagnosis not present

## 2022-06-25 DIAGNOSIS — N2581 Secondary hyperparathyroidism of renal origin: Secondary | ICD-10-CM | POA: Diagnosis not present

## 2022-06-25 DIAGNOSIS — Z992 Dependence on renal dialysis: Secondary | ICD-10-CM | POA: Diagnosis not present

## 2022-06-25 DIAGNOSIS — N186 End stage renal disease: Secondary | ICD-10-CM | POA: Diagnosis not present

## 2022-06-27 DIAGNOSIS — N2581 Secondary hyperparathyroidism of renal origin: Secondary | ICD-10-CM | POA: Diagnosis not present

## 2022-06-27 DIAGNOSIS — Z992 Dependence on renal dialysis: Secondary | ICD-10-CM | POA: Diagnosis not present

## 2022-06-27 DIAGNOSIS — N186 End stage renal disease: Secondary | ICD-10-CM | POA: Diagnosis not present

## 2022-06-30 DIAGNOSIS — N2581 Secondary hyperparathyroidism of renal origin: Secondary | ICD-10-CM | POA: Diagnosis not present

## 2022-06-30 DIAGNOSIS — N186 End stage renal disease: Secondary | ICD-10-CM | POA: Diagnosis not present

## 2022-06-30 DIAGNOSIS — Z992 Dependence on renal dialysis: Secondary | ICD-10-CM | POA: Diagnosis not present

## 2022-07-02 DIAGNOSIS — N2581 Secondary hyperparathyroidism of renal origin: Secondary | ICD-10-CM | POA: Diagnosis not present

## 2022-07-02 DIAGNOSIS — Z992 Dependence on renal dialysis: Secondary | ICD-10-CM | POA: Diagnosis not present

## 2022-07-02 DIAGNOSIS — N186 End stage renal disease: Secondary | ICD-10-CM | POA: Diagnosis not present

## 2022-07-08 DIAGNOSIS — N184 Chronic kidney disease, stage 4 (severe): Secondary | ICD-10-CM | POA: Diagnosis not present

## 2022-07-09 DIAGNOSIS — Z992 Dependence on renal dialysis: Secondary | ICD-10-CM | POA: Diagnosis not present

## 2022-07-09 DIAGNOSIS — K649 Unspecified hemorrhoids: Secondary | ICD-10-CM | POA: Diagnosis not present

## 2022-07-09 DIAGNOSIS — F17210 Nicotine dependence, cigarettes, uncomplicated: Secondary | ICD-10-CM | POA: Diagnosis not present

## 2022-07-09 DIAGNOSIS — R197 Diarrhea, unspecified: Secondary | ICD-10-CM | POA: Diagnosis not present

## 2022-07-09 DIAGNOSIS — N2581 Secondary hyperparathyroidism of renal origin: Secondary | ICD-10-CM | POA: Diagnosis not present

## 2022-07-09 DIAGNOSIS — N186 End stage renal disease: Secondary | ICD-10-CM | POA: Diagnosis not present

## 2022-07-09 DIAGNOSIS — K625 Hemorrhage of anus and rectum: Secondary | ICD-10-CM | POA: Diagnosis not present

## 2022-07-11 DIAGNOSIS — N186 End stage renal disease: Secondary | ICD-10-CM | POA: Diagnosis not present

## 2022-07-11 DIAGNOSIS — Z992 Dependence on renal dialysis: Secondary | ICD-10-CM | POA: Diagnosis not present

## 2022-07-11 DIAGNOSIS — N2581 Secondary hyperparathyroidism of renal origin: Secondary | ICD-10-CM | POA: Diagnosis not present

## 2022-07-14 DIAGNOSIS — Z992 Dependence on renal dialysis: Secondary | ICD-10-CM | POA: Diagnosis not present

## 2022-07-14 DIAGNOSIS — N186 End stage renal disease: Secondary | ICD-10-CM | POA: Diagnosis not present

## 2022-07-14 DIAGNOSIS — N2581 Secondary hyperparathyroidism of renal origin: Secondary | ICD-10-CM | POA: Diagnosis not present

## 2022-07-16 DIAGNOSIS — N2581 Secondary hyperparathyroidism of renal origin: Secondary | ICD-10-CM | POA: Diagnosis not present

## 2022-07-16 DIAGNOSIS — N186 End stage renal disease: Secondary | ICD-10-CM | POA: Diagnosis not present

## 2022-07-16 DIAGNOSIS — E1121 Type 2 diabetes mellitus with diabetic nephropathy: Secondary | ICD-10-CM | POA: Diagnosis not present

## 2022-07-16 DIAGNOSIS — Z992 Dependence on renal dialysis: Secondary | ICD-10-CM | POA: Diagnosis not present

## 2022-07-18 DIAGNOSIS — N186 End stage renal disease: Secondary | ICD-10-CM | POA: Diagnosis not present

## 2022-07-18 DIAGNOSIS — Z992 Dependence on renal dialysis: Secondary | ICD-10-CM | POA: Diagnosis not present

## 2022-07-18 DIAGNOSIS — N2581 Secondary hyperparathyroidism of renal origin: Secondary | ICD-10-CM | POA: Diagnosis not present

## 2022-07-21 DIAGNOSIS — Z992 Dependence on renal dialysis: Secondary | ICD-10-CM | POA: Diagnosis not present

## 2022-07-21 DIAGNOSIS — N2581 Secondary hyperparathyroidism of renal origin: Secondary | ICD-10-CM | POA: Diagnosis not present

## 2022-07-21 DIAGNOSIS — N186 End stage renal disease: Secondary | ICD-10-CM | POA: Diagnosis not present

## 2022-07-23 DIAGNOSIS — N186 End stage renal disease: Secondary | ICD-10-CM | POA: Diagnosis not present

## 2022-07-23 DIAGNOSIS — Z992 Dependence on renal dialysis: Secondary | ICD-10-CM | POA: Diagnosis not present

## 2022-07-23 DIAGNOSIS — N2581 Secondary hyperparathyroidism of renal origin: Secondary | ICD-10-CM | POA: Diagnosis not present

## 2022-07-25 DIAGNOSIS — N2581 Secondary hyperparathyroidism of renal origin: Secondary | ICD-10-CM | POA: Diagnosis not present

## 2022-07-25 DIAGNOSIS — N186 End stage renal disease: Secondary | ICD-10-CM | POA: Diagnosis not present

## 2022-07-25 DIAGNOSIS — Z992 Dependence on renal dialysis: Secondary | ICD-10-CM | POA: Diagnosis not present

## 2022-07-28 DIAGNOSIS — N2581 Secondary hyperparathyroidism of renal origin: Secondary | ICD-10-CM | POA: Diagnosis not present

## 2022-07-28 DIAGNOSIS — Z992 Dependence on renal dialysis: Secondary | ICD-10-CM | POA: Diagnosis not present

## 2022-07-28 DIAGNOSIS — N186 End stage renal disease: Secondary | ICD-10-CM | POA: Diagnosis not present

## 2022-07-29 DIAGNOSIS — R195 Other fecal abnormalities: Secondary | ICD-10-CM | POA: Diagnosis not present

## 2022-07-29 DIAGNOSIS — Z720 Tobacco use: Secondary | ICD-10-CM | POA: Diagnosis not present

## 2022-07-29 DIAGNOSIS — K921 Melena: Secondary | ICD-10-CM | POA: Diagnosis not present

## 2022-07-29 DIAGNOSIS — N186 End stage renal disease: Secondary | ICD-10-CM | POA: Diagnosis not present

## 2022-07-30 DIAGNOSIS — Z992 Dependence on renal dialysis: Secondary | ICD-10-CM | POA: Diagnosis not present

## 2022-07-30 DIAGNOSIS — N186 End stage renal disease: Secondary | ICD-10-CM | POA: Diagnosis not present

## 2022-07-30 DIAGNOSIS — N2581 Secondary hyperparathyroidism of renal origin: Secondary | ICD-10-CM | POA: Diagnosis not present

## 2022-08-01 DIAGNOSIS — Z992 Dependence on renal dialysis: Secondary | ICD-10-CM | POA: Diagnosis not present

## 2022-08-01 DIAGNOSIS — N186 End stage renal disease: Secondary | ICD-10-CM | POA: Diagnosis not present

## 2022-08-01 DIAGNOSIS — N2581 Secondary hyperparathyroidism of renal origin: Secondary | ICD-10-CM | POA: Diagnosis not present

## 2022-08-06 DIAGNOSIS — N2581 Secondary hyperparathyroidism of renal origin: Secondary | ICD-10-CM | POA: Diagnosis not present

## 2022-08-06 DIAGNOSIS — N186 End stage renal disease: Secondary | ICD-10-CM | POA: Diagnosis not present

## 2022-08-06 DIAGNOSIS — Z992 Dependence on renal dialysis: Secondary | ICD-10-CM | POA: Diagnosis not present

## 2022-08-08 DIAGNOSIS — N186 End stage renal disease: Secondary | ICD-10-CM | POA: Diagnosis not present

## 2022-08-08 DIAGNOSIS — N2581 Secondary hyperparathyroidism of renal origin: Secondary | ICD-10-CM | POA: Diagnosis not present

## 2022-08-08 DIAGNOSIS — Z992 Dependence on renal dialysis: Secondary | ICD-10-CM | POA: Diagnosis not present

## 2022-08-11 DIAGNOSIS — N186 End stage renal disease: Secondary | ICD-10-CM | POA: Diagnosis not present

## 2022-08-11 DIAGNOSIS — Z992 Dependence on renal dialysis: Secondary | ICD-10-CM | POA: Diagnosis not present

## 2022-08-11 DIAGNOSIS — N2581 Secondary hyperparathyroidism of renal origin: Secondary | ICD-10-CM | POA: Diagnosis not present

## 2022-08-13 DIAGNOSIS — Z992 Dependence on renal dialysis: Secondary | ICD-10-CM | POA: Diagnosis not present

## 2022-08-13 DIAGNOSIS — N2581 Secondary hyperparathyroidism of renal origin: Secondary | ICD-10-CM | POA: Diagnosis not present

## 2022-08-13 DIAGNOSIS — N186 End stage renal disease: Secondary | ICD-10-CM | POA: Diagnosis not present

## 2022-08-14 DIAGNOSIS — M5116 Intervertebral disc disorders with radiculopathy, lumbar region: Secondary | ICD-10-CM | POA: Diagnosis not present

## 2022-08-14 DIAGNOSIS — M5136 Other intervertebral disc degeneration, lumbar region: Secondary | ICD-10-CM | POA: Diagnosis not present

## 2022-08-14 DIAGNOSIS — M4726 Other spondylosis with radiculopathy, lumbar region: Secondary | ICD-10-CM | POA: Diagnosis not present

## 2022-08-14 DIAGNOSIS — M4727 Other spondylosis with radiculopathy, lumbosacral region: Secondary | ICD-10-CM | POA: Diagnosis not present

## 2022-08-14 DIAGNOSIS — M48061 Spinal stenosis, lumbar region without neurogenic claudication: Secondary | ICD-10-CM | POA: Diagnosis not present

## 2022-08-14 DIAGNOSIS — M4807 Spinal stenosis, lumbosacral region: Secondary | ICD-10-CM | POA: Diagnosis not present

## 2022-08-14 DIAGNOSIS — M5117 Intervertebral disc disorders with radiculopathy, lumbosacral region: Secondary | ICD-10-CM | POA: Diagnosis not present

## 2022-08-14 DIAGNOSIS — M47817 Spondylosis without myelopathy or radiculopathy, lumbosacral region: Secondary | ICD-10-CM | POA: Diagnosis not present

## 2022-08-14 DIAGNOSIS — Z981 Arthrodesis status: Secondary | ICD-10-CM | POA: Diagnosis not present

## 2022-08-14 DIAGNOSIS — M961 Postlaminectomy syndrome, not elsewhere classified: Secondary | ICD-10-CM | POA: Diagnosis not present

## 2022-08-15 DIAGNOSIS — N2581 Secondary hyperparathyroidism of renal origin: Secondary | ICD-10-CM | POA: Diagnosis not present

## 2022-08-15 DIAGNOSIS — N186 End stage renal disease: Secondary | ICD-10-CM | POA: Diagnosis not present

## 2022-08-15 DIAGNOSIS — Z992 Dependence on renal dialysis: Secondary | ICD-10-CM | POA: Diagnosis not present

## 2022-08-16 DIAGNOSIS — N186 End stage renal disease: Secondary | ICD-10-CM | POA: Diagnosis not present

## 2022-08-16 DIAGNOSIS — Z992 Dependence on renal dialysis: Secondary | ICD-10-CM | POA: Diagnosis not present

## 2022-08-16 DIAGNOSIS — E1121 Type 2 diabetes mellitus with diabetic nephropathy: Secondary | ICD-10-CM | POA: Diagnosis not present

## 2022-08-18 DIAGNOSIS — N186 End stage renal disease: Secondary | ICD-10-CM | POA: Diagnosis not present

## 2022-08-18 DIAGNOSIS — Z992 Dependence on renal dialysis: Secondary | ICD-10-CM | POA: Diagnosis not present

## 2022-08-18 DIAGNOSIS — N2581 Secondary hyperparathyroidism of renal origin: Secondary | ICD-10-CM | POA: Diagnosis not present

## 2022-08-20 DIAGNOSIS — N186 End stage renal disease: Secondary | ICD-10-CM | POA: Diagnosis not present

## 2022-08-20 DIAGNOSIS — Z992 Dependence on renal dialysis: Secondary | ICD-10-CM | POA: Diagnosis not present

## 2022-08-20 DIAGNOSIS — N2581 Secondary hyperparathyroidism of renal origin: Secondary | ICD-10-CM | POA: Diagnosis not present

## 2022-08-21 DIAGNOSIS — M48061 Spinal stenosis, lumbar region without neurogenic claudication: Secondary | ICD-10-CM | POA: Diagnosis not present

## 2022-08-21 DIAGNOSIS — G8929 Other chronic pain: Secondary | ICD-10-CM | POA: Diagnosis not present

## 2022-08-21 DIAGNOSIS — M47816 Spondylosis without myelopathy or radiculopathy, lumbar region: Secondary | ICD-10-CM | POA: Diagnosis not present

## 2022-08-21 DIAGNOSIS — Z8673 Personal history of transient ischemic attack (TIA), and cerebral infarction without residual deficits: Secondary | ICD-10-CM | POA: Diagnosis not present

## 2022-08-21 DIAGNOSIS — Z981 Arthrodesis status: Secondary | ICD-10-CM | POA: Diagnosis not present

## 2022-08-21 DIAGNOSIS — M5136 Other intervertebral disc degeneration, lumbar region: Secondary | ICD-10-CM | POA: Diagnosis not present

## 2022-08-21 DIAGNOSIS — Z79891 Long term (current) use of opiate analgesic: Secondary | ICD-10-CM | POA: Diagnosis not present

## 2022-08-21 DIAGNOSIS — N186 End stage renal disease: Secondary | ICD-10-CM | POA: Diagnosis not present

## 2022-08-22 DIAGNOSIS — N186 End stage renal disease: Secondary | ICD-10-CM | POA: Diagnosis not present

## 2022-08-22 DIAGNOSIS — N2581 Secondary hyperparathyroidism of renal origin: Secondary | ICD-10-CM | POA: Diagnosis not present

## 2022-08-22 DIAGNOSIS — Z992 Dependence on renal dialysis: Secondary | ICD-10-CM | POA: Diagnosis not present

## 2022-08-25 DIAGNOSIS — Z862 Personal history of diseases of the blood and blood-forming organs and certain disorders involving the immune mechanism: Secondary | ICD-10-CM | POA: Diagnosis not present

## 2022-08-25 DIAGNOSIS — N186 End stage renal disease: Secondary | ICD-10-CM | POA: Diagnosis not present

## 2022-08-25 DIAGNOSIS — K625 Hemorrhage of anus and rectum: Secondary | ICD-10-CM | POA: Diagnosis not present

## 2022-08-25 DIAGNOSIS — I120 Hypertensive chronic kidney disease with stage 5 chronic kidney disease or end stage renal disease: Secondary | ICD-10-CM | POA: Diagnosis not present

## 2022-08-25 DIAGNOSIS — F17210 Nicotine dependence, cigarettes, uncomplicated: Secondary | ICD-10-CM | POA: Diagnosis not present

## 2022-08-25 DIAGNOSIS — Z992 Dependence on renal dialysis: Secondary | ICD-10-CM | POA: Diagnosis not present

## 2022-08-27 DIAGNOSIS — Z79899 Other long term (current) drug therapy: Secondary | ICD-10-CM | POA: Diagnosis not present

## 2022-08-27 DIAGNOSIS — Z992 Dependence on renal dialysis: Secondary | ICD-10-CM | POA: Diagnosis not present

## 2022-08-27 DIAGNOSIS — Z794 Long term (current) use of insulin: Secondary | ICD-10-CM | POA: Diagnosis not present

## 2022-08-27 DIAGNOSIS — E1122 Type 2 diabetes mellitus with diabetic chronic kidney disease: Secondary | ICD-10-CM | POA: Diagnosis not present

## 2022-08-27 DIAGNOSIS — N2581 Secondary hyperparathyroidism of renal origin: Secondary | ICD-10-CM | POA: Diagnosis not present

## 2022-08-27 DIAGNOSIS — N186 End stage renal disease: Secondary | ICD-10-CM | POA: Diagnosis not present

## 2022-08-27 DIAGNOSIS — E785 Hyperlipidemia, unspecified: Secondary | ICD-10-CM | POA: Diagnosis not present

## 2022-09-02 DIAGNOSIS — Z992 Dependence on renal dialysis: Secondary | ICD-10-CM | POA: Diagnosis not present

## 2022-09-02 DIAGNOSIS — T503X6A Underdosing of electrolytic, caloric and water-balance agents, initial encounter: Secondary | ICD-10-CM | POA: Diagnosis not present

## 2022-09-02 DIAGNOSIS — G8929 Other chronic pain: Secondary | ICD-10-CM | POA: Diagnosis not present

## 2022-09-02 DIAGNOSIS — F17210 Nicotine dependence, cigarettes, uncomplicated: Secondary | ICD-10-CM | POA: Diagnosis not present

## 2022-09-02 DIAGNOSIS — M791 Myalgia, unspecified site: Secondary | ICD-10-CM | POA: Diagnosis not present

## 2022-09-02 DIAGNOSIS — R404 Transient alteration of awareness: Secondary | ICD-10-CM | POA: Diagnosis not present

## 2022-09-02 DIAGNOSIS — Z91158 Patient's noncompliance with renal dialysis for other reason: Secondary | ICD-10-CM | POA: Diagnosis not present

## 2022-09-02 DIAGNOSIS — R1030 Lower abdominal pain, unspecified: Secondary | ICD-10-CM | POA: Diagnosis not present

## 2022-09-02 DIAGNOSIS — D72829 Elevated white blood cell count, unspecified: Secondary | ICD-10-CM | POA: Diagnosis not present

## 2022-09-02 DIAGNOSIS — K7460 Unspecified cirrhosis of liver: Secondary | ICD-10-CM | POA: Diagnosis not present

## 2022-09-02 DIAGNOSIS — K828 Other specified diseases of gallbladder: Secondary | ICD-10-CM | POA: Diagnosis not present

## 2022-09-02 DIAGNOSIS — E1122 Type 2 diabetes mellitus with diabetic chronic kidney disease: Secondary | ICD-10-CM | POA: Diagnosis not present

## 2022-09-02 DIAGNOSIS — N186 End stage renal disease: Secondary | ICD-10-CM | POA: Diagnosis not present

## 2022-09-02 DIAGNOSIS — R569 Unspecified convulsions: Secondary | ICD-10-CM | POA: Diagnosis not present

## 2022-09-02 DIAGNOSIS — E875 Hyperkalemia: Secondary | ICD-10-CM | POA: Diagnosis not present

## 2022-09-02 DIAGNOSIS — G459 Transient cerebral ischemic attack, unspecified: Secondary | ICD-10-CM | POA: Diagnosis not present

## 2022-09-02 DIAGNOSIS — I120 Hypertensive chronic kidney disease with stage 5 chronic kidney disease or end stage renal disease: Secondary | ICD-10-CM | POA: Diagnosis not present

## 2022-09-02 DIAGNOSIS — G9340 Encephalopathy, unspecified: Secondary | ICD-10-CM | POA: Diagnosis not present

## 2022-09-03 DIAGNOSIS — F17210 Nicotine dependence, cigarettes, uncomplicated: Secondary | ICD-10-CM | POA: Diagnosis not present

## 2022-09-03 DIAGNOSIS — M545 Low back pain, unspecified: Secondary | ICD-10-CM | POA: Diagnosis not present

## 2022-09-03 DIAGNOSIS — R569 Unspecified convulsions: Secondary | ICD-10-CM | POA: Diagnosis not present

## 2022-09-03 DIAGNOSIS — R1032 Left lower quadrant pain: Secondary | ICD-10-CM | POA: Diagnosis not present

## 2022-09-03 DIAGNOSIS — N186 End stage renal disease: Secondary | ICD-10-CM | POA: Diagnosis not present

## 2022-09-03 DIAGNOSIS — R1030 Lower abdominal pain, unspecified: Secondary | ICD-10-CM | POA: Diagnosis not present

## 2022-09-03 DIAGNOSIS — R531 Weakness: Secondary | ICD-10-CM | POA: Diagnosis not present

## 2022-09-03 DIAGNOSIS — M791 Myalgia, unspecified site: Secondary | ICD-10-CM | POA: Diagnosis not present

## 2022-09-03 DIAGNOSIS — E1122 Type 2 diabetes mellitus with diabetic chronic kidney disease: Secondary | ICD-10-CM | POA: Diagnosis not present

## 2022-09-03 DIAGNOSIS — E875 Hyperkalemia: Secondary | ICD-10-CM | POA: Diagnosis not present

## 2022-09-03 DIAGNOSIS — I120 Hypertensive chronic kidney disease with stage 5 chronic kidney disease or end stage renal disease: Secondary | ICD-10-CM | POA: Diagnosis not present

## 2022-09-03 DIAGNOSIS — R404 Transient alteration of awareness: Secondary | ICD-10-CM | POA: Diagnosis not present

## 2022-09-03 DIAGNOSIS — K7460 Unspecified cirrhosis of liver: Secondary | ICD-10-CM | POA: Diagnosis not present

## 2022-09-03 DIAGNOSIS — G459 Transient cerebral ischemic attack, unspecified: Secondary | ICD-10-CM | POA: Diagnosis not present

## 2022-09-03 DIAGNOSIS — G9340 Encephalopathy, unspecified: Secondary | ICD-10-CM | POA: Diagnosis not present

## 2022-09-03 DIAGNOSIS — R1031 Right lower quadrant pain: Secondary | ICD-10-CM | POA: Diagnosis not present

## 2022-09-03 DIAGNOSIS — K828 Other specified diseases of gallbladder: Secondary | ICD-10-CM | POA: Diagnosis not present

## 2022-09-04 DIAGNOSIS — E875 Hyperkalemia: Secondary | ICD-10-CM | POA: Diagnosis not present

## 2022-09-05 DIAGNOSIS — N2581 Secondary hyperparathyroidism of renal origin: Secondary | ICD-10-CM | POA: Diagnosis not present

## 2022-09-05 DIAGNOSIS — N186 End stage renal disease: Secondary | ICD-10-CM | POA: Diagnosis not present

## 2022-09-05 DIAGNOSIS — Z992 Dependence on renal dialysis: Secondary | ICD-10-CM | POA: Diagnosis not present

## 2022-09-08 DIAGNOSIS — Z992 Dependence on renal dialysis: Secondary | ICD-10-CM | POA: Diagnosis not present

## 2022-09-08 DIAGNOSIS — N186 End stage renal disease: Secondary | ICD-10-CM | POA: Diagnosis not present

## 2022-09-08 DIAGNOSIS — N2581 Secondary hyperparathyroidism of renal origin: Secondary | ICD-10-CM | POA: Diagnosis not present

## 2022-09-10 DIAGNOSIS — Z992 Dependence on renal dialysis: Secondary | ICD-10-CM | POA: Diagnosis not present

## 2022-09-10 DIAGNOSIS — N186 End stage renal disease: Secondary | ICD-10-CM | POA: Diagnosis not present

## 2022-09-10 DIAGNOSIS — N2581 Secondary hyperparathyroidism of renal origin: Secondary | ICD-10-CM | POA: Diagnosis not present

## 2022-09-11 DIAGNOSIS — G894 Chronic pain syndrome: Secondary | ICD-10-CM | POA: Diagnosis not present

## 2022-09-11 DIAGNOSIS — Z6827 Body mass index (BMI) 27.0-27.9, adult: Secondary | ICD-10-CM | POA: Diagnosis not present

## 2022-09-11 DIAGNOSIS — I10 Essential (primary) hypertension: Secondary | ICD-10-CM | POA: Diagnosis not present

## 2022-09-11 DIAGNOSIS — J440 Chronic obstructive pulmonary disease with acute lower respiratory infection: Secondary | ICD-10-CM | POA: Diagnosis not present

## 2022-09-11 DIAGNOSIS — Z719 Counseling, unspecified: Secondary | ICD-10-CM | POA: Diagnosis not present

## 2022-09-11 DIAGNOSIS — N186 End stage renal disease: Secondary | ICD-10-CM | POA: Diagnosis not present

## 2022-09-11 DIAGNOSIS — Z720 Tobacco use: Secondary | ICD-10-CM | POA: Diagnosis not present

## 2022-09-11 DIAGNOSIS — E119 Type 2 diabetes mellitus without complications: Secondary | ICD-10-CM | POA: Diagnosis not present

## 2022-09-11 DIAGNOSIS — E785 Hyperlipidemia, unspecified: Secondary | ICD-10-CM | POA: Diagnosis not present

## 2022-09-12 DIAGNOSIS — Z992 Dependence on renal dialysis: Secondary | ICD-10-CM | POA: Diagnosis not present

## 2022-09-12 DIAGNOSIS — N2581 Secondary hyperparathyroidism of renal origin: Secondary | ICD-10-CM | POA: Diagnosis not present

## 2022-09-12 DIAGNOSIS — N186 End stage renal disease: Secondary | ICD-10-CM | POA: Diagnosis not present

## 2023-03-18 DEATH — deceased
